# Patient Record
Sex: Female | Born: 1975 | Race: White | Hispanic: No | Marital: Married | Smoking: Never smoker
Health system: Southern US, Community
[De-identification: ages and names within clinical notes are randomized; demographics above are authoritative.]

## PROBLEM LIST (undated history)

## (undated) DIAGNOSIS — D219 Benign neoplasm of connective and other soft tissue, unspecified: Secondary | ICD-10-CM

## (undated) DIAGNOSIS — N889 Noninflammatory disorder of cervix uteri, unspecified: Secondary | ICD-10-CM

## (undated) DIAGNOSIS — Q512 Other doubling of uterus, unspecified: Secondary | ICD-10-CM

## (undated) DIAGNOSIS — Q5128 Other doubling of uterus, other specified: Secondary | ICD-10-CM

## (undated) HISTORY — PX: DILATION AND CURETTAGE OF UTERUS: SHX78

---

## 1998-07-25 ENCOUNTER — Other Ambulatory Visit: Admission: RE | Admit: 1998-07-25 | Discharge: 1998-07-25 | Payer: Self-pay | Admitting: Obstetrics and Gynecology

## 1999-07-27 ENCOUNTER — Other Ambulatory Visit: Admission: RE | Admit: 1999-07-27 | Discharge: 1999-07-27 | Payer: Self-pay | Admitting: Obstetrics and Gynecology

## 2004-10-03 ENCOUNTER — Ambulatory Visit: Payer: Self-pay | Admitting: Gastroenterology

## 2004-10-16 ENCOUNTER — Ambulatory Visit: Payer: Self-pay | Admitting: Gastroenterology

## 2005-05-22 ENCOUNTER — Ambulatory Visit: Payer: Self-pay | Admitting: Gastroenterology

## 2006-06-26 IMAGING — US US PELV - US TRANSVAGINAL
1 series · 17 of 25 positions shown · non-contrast
Comparison: none

REASON FOR EXAM: PELVIS PAIN
COMMENTS:

[Series 1: us pelv - us transvaginal · 17 of 44 slices shown]
[im 1/44]
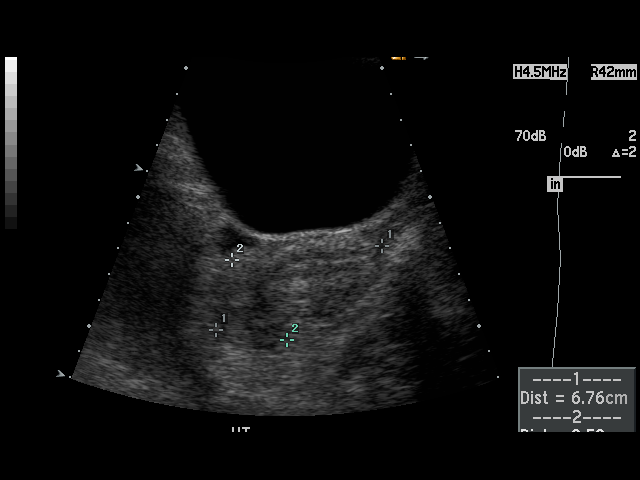
[im 4/44]
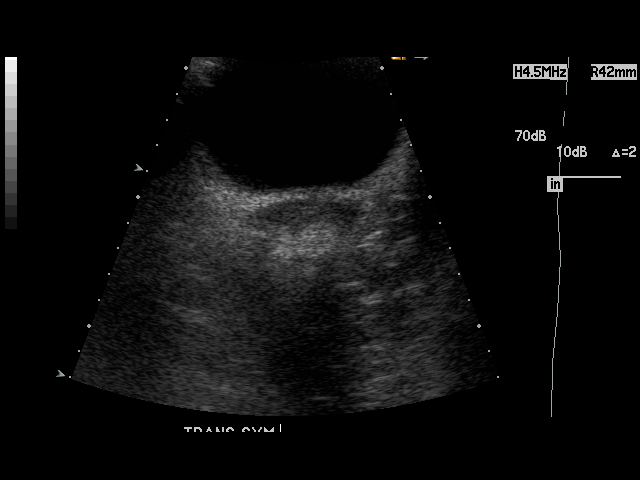
[im 6/44]
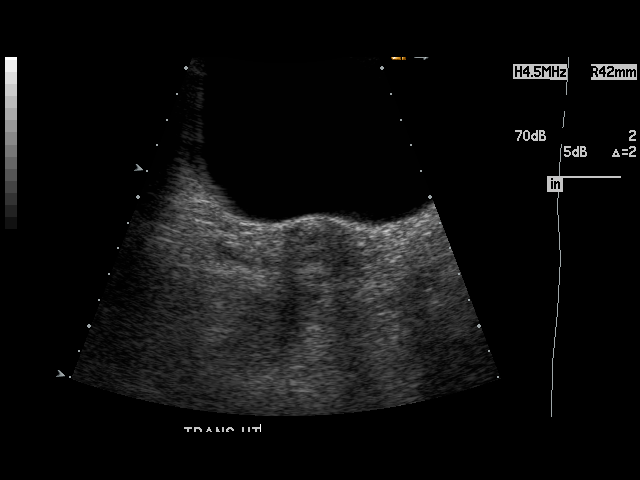
[im 9/44]
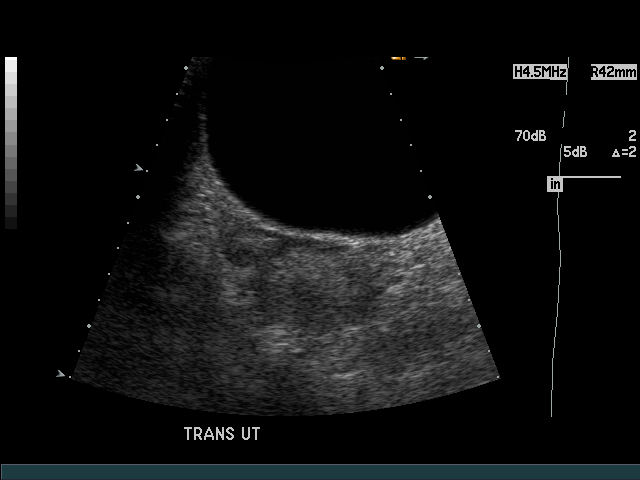
[im 11/44]
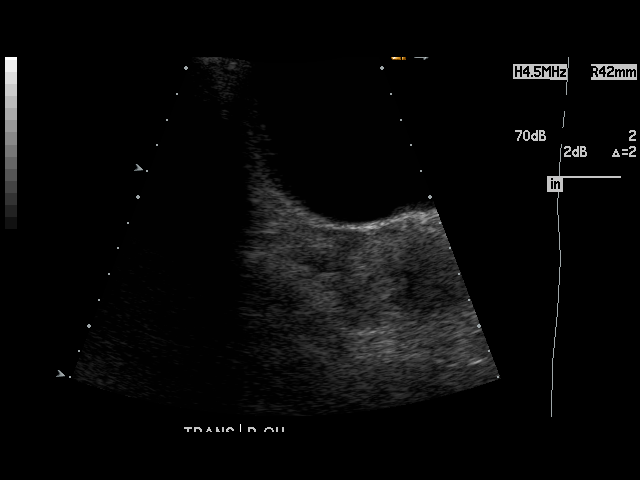
[im 15/44]
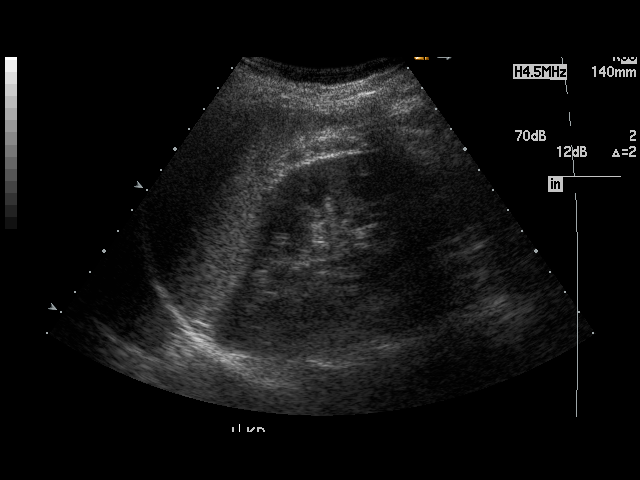
[im 17/44]
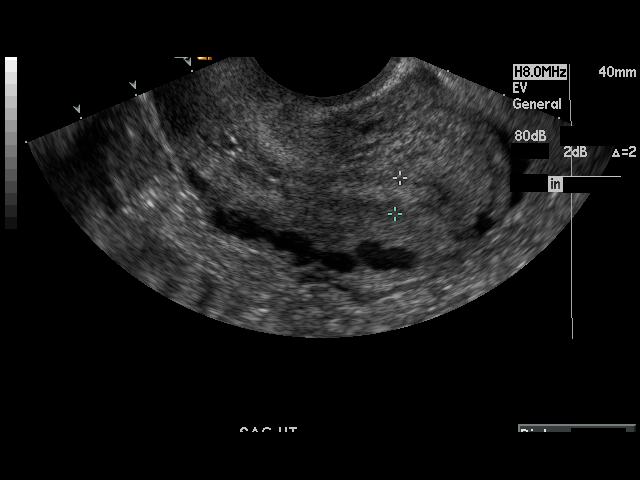
[im 20/44]
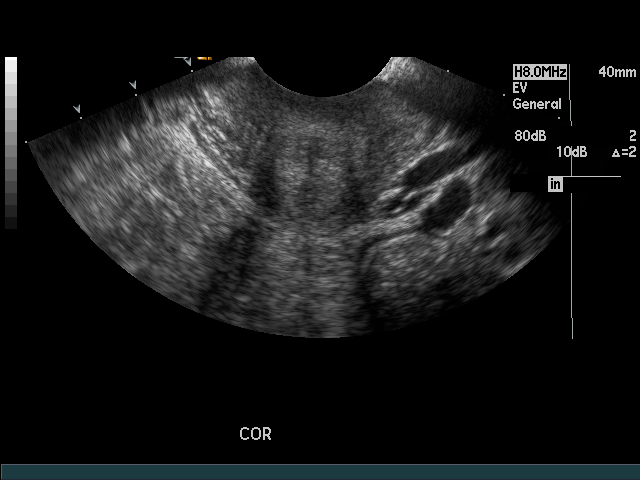
[im 22/44]
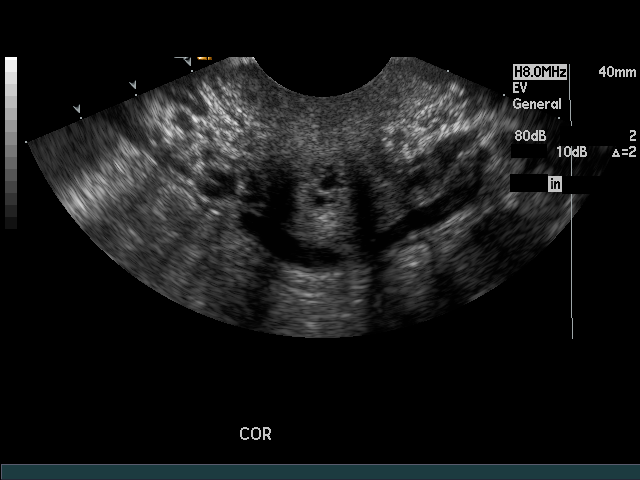
[im 24/44]
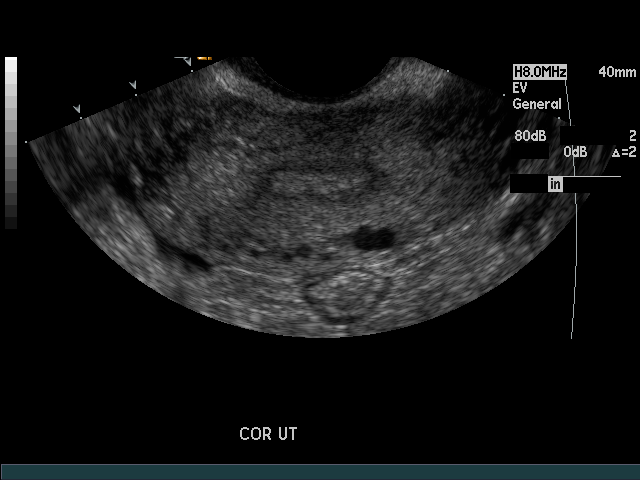
[im 27/44]
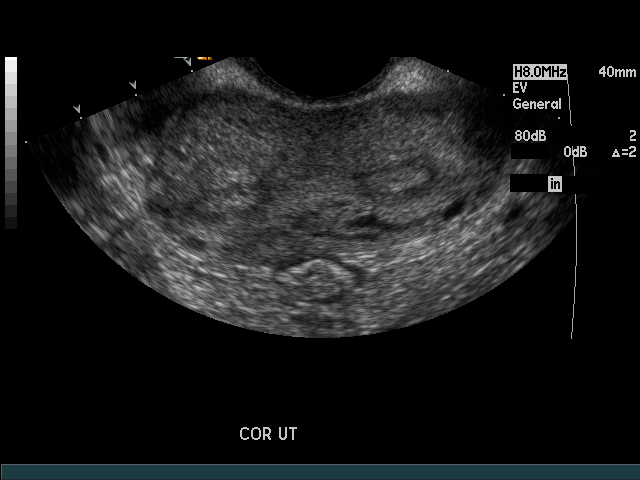
[im 29/44]
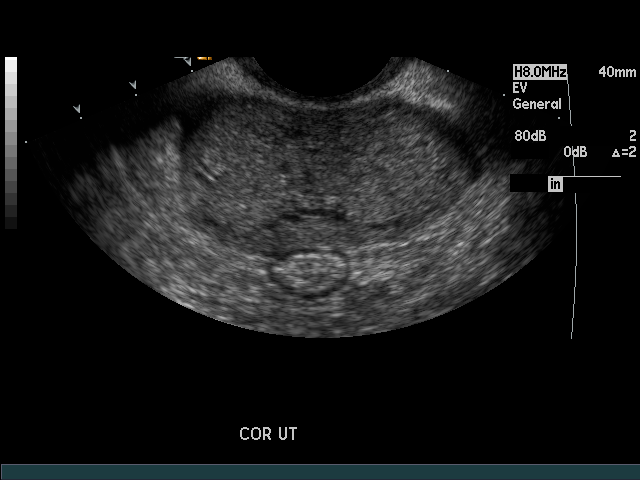
[im 33/44]
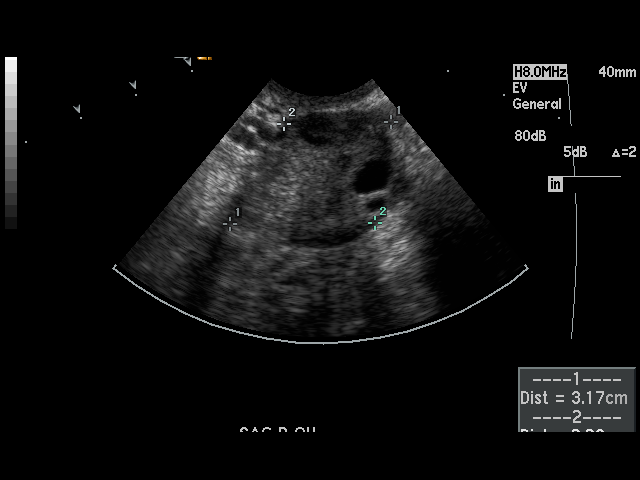
[im 35/44]
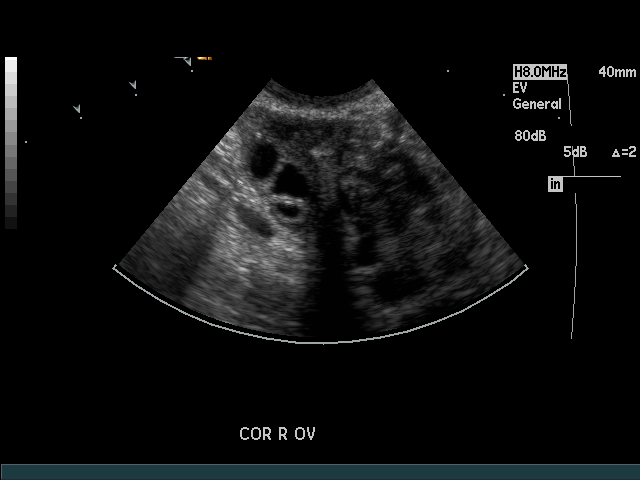
[im 38/44]
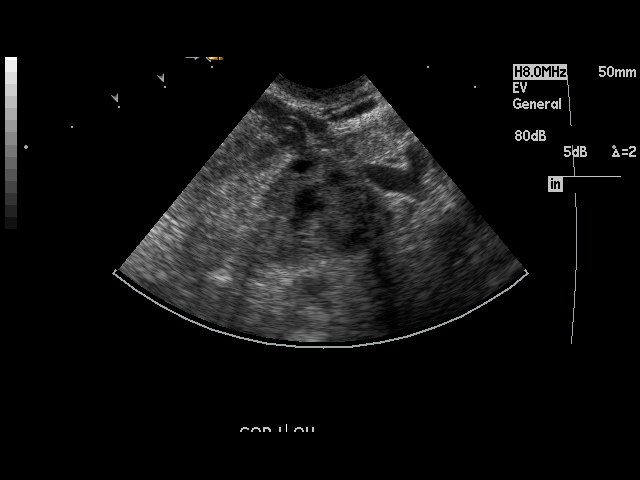
[im 40/44]
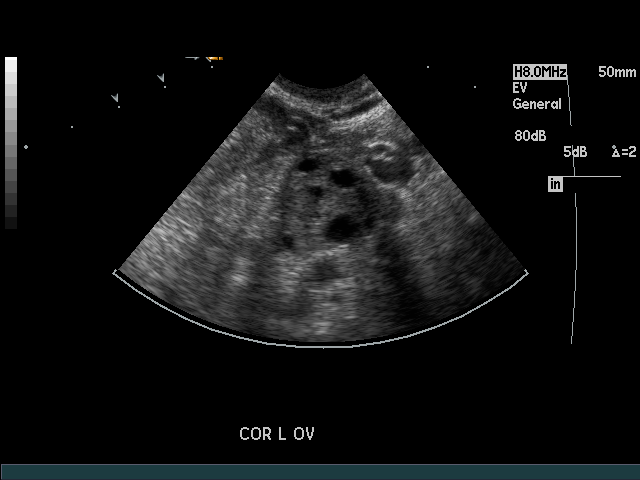
[im 44/44]
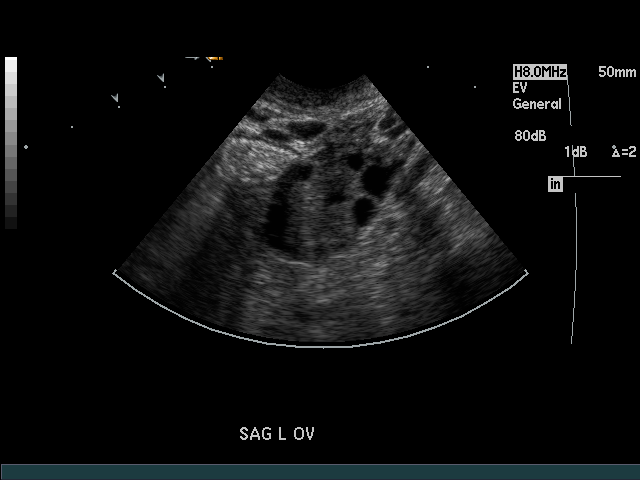

[17 of 25 positions shown; findings below may reference images not displayed]

PROCEDURE:     US  - US PELVIS MASS EXAM  - [DATE] [DATE] [DATE] [DATE]

RESULT:     Sonographic evaluation of the pelvis shows what appears to be a
bicornuate uterus.  The overall uterine length is 6.8 cm.  The ovaries
appear to be normal. There is fluid adjacent to the uterus. Multiple ovarian
follicles or cysts are present but are relatively small in size.
IMPRESSION: 1)There is some free fluid present in the pelvis.

2)Apparent bicornuate uterus.  Normal appearing ovaries.

## 2008-11-29 ENCOUNTER — Ambulatory Visit: Payer: Self-pay | Admitting: Gastroenterology

## 2009-11-12 ENCOUNTER — Ambulatory Visit: Payer: Self-pay | Admitting: Internal Medicine

## 2010-08-13 HISTORY — PX: TOTAL THYROIDECTOMY: SHX2547

## 2010-08-13 NOTE — L&D Delivery Note (Signed)
Delivery Note Called to check patient who was found to be completely dilated with bulging membranes.  Pt comfortable with epidural.  Amniotomy performed for port wine colored fluid.  At 0857,   a non-viable and stillborn female  was delivered spontaneously complete breech.  Legs and right arm delivered easily and left arm assisted out. There was some closure of cervix oven neck, so Dr Penne Lash was consulted and she delivered the head.  Baby taken to warmer and dried per mother's request. Parents grieving appropriately.  Placenta retained after delivery, so Pitocin 10 units were injected into cord and Cytotec 400mg  administered per rectum.   ;  .  APGAR: 0/0 , ; weight  TBA.   Placenta status: Undelivered. , . Cord pH: n/a.     Placenta to pathology. Cultures done.   Anesthesia: Epidural Episiotomy: none Lacerations: none Suture Repair: n/a Est. Blood Loss (mL):   Mom to postpartum.  Baby to NA.  Wynelle Bourgeois 06/21/2011, 9:21 AM

## 2010-11-08 DIAGNOSIS — Z3400 Encounter for supervision of normal first pregnancy, unspecified trimester: Secondary | ICD-10-CM

## 2010-11-13 ENCOUNTER — Ambulatory Visit: Payer: 59 | Admitting: Obstetrics and Gynecology

## 2010-11-13 DIAGNOSIS — O021 Missed abortion: Secondary | ICD-10-CM

## 2010-11-14 NOTE — Assessment & Plan Note (Unsigned)
NAME:  Annette Nguyen, Annette Nguyen NO.:  [id number]  MEDICAL RECORD NO.:  [id number]           PATIENT TYPE:  LOCATION:  [facility]           FACILITY:  PHYSICIAN:  Argentina Donovan, MD             DATE OF BIRTH:  DATE OF SERVICE:  11/13/2010                                 CLINIC NOTE  The patient is a 35 year old gravida 1 and para 0-0-1-0 who based on her last period, should have been 9 weeks 4 days, last week at 8 weeks 6 days according last menstrual period measured at 6 weeks 1 day.  An ultrasound with no fetal heart detected.  At that point, the [facility], then a beta was drawn which was 77,000.  She had one following here which was [phone].  I have talked to her about this probably would being a missed abortion with her husband and mother there, and we discussed the treatments being D and C or Cytotec p.o. or per vagina and because beta hCG was done in 2 different laboratories, I felt better about having it repeated one more time while will come back tomorrow for the results and if her choice, we will give her Cytotec 800 mcg to be used in something in addition for pain.  IMPRESSION:  Probable missed abortion.          ______________________________ Argentina Donovan, MD    PR/MEDQ  D:  11/13/2010  T:  11/14/2010  Job:  [id number]

## 2010-11-15 ENCOUNTER — Other Ambulatory Visit: Payer: 59

## 2010-11-16 ENCOUNTER — Other Ambulatory Visit: Payer: 59

## 2010-11-16 DIAGNOSIS — O021 Missed abortion: Secondary | ICD-10-CM

## 2010-11-17 ENCOUNTER — Ambulatory Visit (HOSPITAL_COMMUNITY)
Admission: RE | Admit: 2010-11-17 | Discharge: 2010-11-17 | Disposition: A | Discharge status: Home / Self Care | Payer: 59 | Source: Ambulatory Visit | Attending: Obstetrics & Gynecology | Admitting: Obstetrics & Gynecology

## 2010-11-17 ENCOUNTER — Other Ambulatory Visit: Payer: Self-pay | Admitting: Obstetrics & Gynecology

## 2010-11-17 DIAGNOSIS — O021 Missed abortion: Secondary | ICD-10-CM | POA: Insufficient documentation

## 2010-11-17 LAB — CBC
HCT: 41.8 % (ref 36.0–46.0)
Hemoglobin: 14.3 g/dL (ref 12.0–15.0)
MCH: 29.5 pg (ref 26.0–34.0)
MCHC: 34.2 g/dL (ref 30.0–36.0)
MCV: 86.4 fL (ref 78.0–100.0)
RDW: 12.7 % (ref 11.5–15.5)

## 2010-11-21 NOTE — Op Note (Signed)
NAMEJACQUELYNN, Nguyen             ACCOUNT NO.:  [id number]  MEDICAL RECORD NO.:  [id number]           PATIENT TYPE:  O  LOCATION:  [facility]                          [facility]  [facility]  PHYSICIAN:  Horton Chin, MD DATE OF BIRTH:  Jul 26, 1976  DATE OF PROCEDURE:  11/17/2010 DATE OF DISCHARGE:                              OPERATIVE REPORT   PREOPERATIVE DIAGNOSIS:  Missed abortion at around [redacted] weeks gestation.  POSTOPERATIVE DIAGNOSIS:  Missed abortion at around [redacted] weeks gestation.  PROCEDURE:  Dilation and evacuation.  SURGEON:  Horton Chin, MD  ANESTHESIOLOGIST:  Belva Agee, MD  ANESTHESIA:  MAC and a paracervical block using 30 mL of 0.5% Marcaine.  IV FLUIDS:  900 amounts of lactated Ringer's.  ESTIMATED BLOOD LOSS:  50 mL.  INDICATIONS:  The patient is a 35 year old gravida 1, now para 0-0-1-0 with known missed AB which measured around 6 weeks size and no cardiac activity and this has been going on over the last 3 weeks.  The patient failed expected management.  She declines misoprostol administration and desired D and E.  Prior to surgery, the risks of the procedure were discussed with the patient including but not limited to: bleeding which might require transfusion, infection which might require antibiotics, injury to uterus or surrounding organs, need for additional procedures, possibility of retained products which might also lead to additional procedures, risk of Asherman syndrome which might cause problems with future fertility and other postoperative and anesthetic complications. All the patient's questions were answered.  Written and informed consent was obtained.  FINDINGS:  Small-sized uterus, moderate amount of products of conception.  SPECIMENS:  Products of conception which were sent to pathology.  COMPLICATIONS:  None immediate.  PROCEDURE IN DETAIL:  The patient received preoperative intravenous doxycycline and had sequential compression  devices applied to her lower extremities.  She was then taken to the operating room where monitored anesthesia care was administered and found to be adequate.  The patient was then placed in a dorsal lithotomy position and prepped and draped in a sterile manner.  Her bladder was catheterized for unmeasured amount of clear yellow urine.  After an adequate time-out was performed, the vaginal speculum was placed.  Her cervix was identified and grasped with a tenaculum on the anterior lip.  A paracervical block was then administered.  Her cervix was dilated up to accommodate the 7-mm curved suction curette which was gently advanced into the uterine fundus.  It was attached to the suction device and the suction was activated.  The curette was slowly rotated to clear the uterus of all products of conception.  This was done a couple of times until a gritty texture was noted.  Complete emptying of the uterus was confirmed by a quick sharp curettage and then the suction curette was reintroduced into the uterine cavity to clear up all remaining debris.  The patient had minimal bleeding after the case.  All instruments were then removed from the patient's pelvis.  The patient tolerated the procedure well.  Sponge, instrument, and needle counts were correct x2.  She was taken to the recovery room  in stable condition.  The patient was given routine discharge instructions after D and E, and she was also told to call the [facility] or come to the emergency room if she has any concerning postoperative issues.  She was also told to call [facility] and make an appointment to be seen in the next 2 weeks for postoperative evaluation.  The patient was given a prescription for Percocet, Ibuprofen, and Colace, all of them 30 tablets each and she was told to call or come back in with any further concerns.     Horton Chin, MD     UAA/MEDQ  D:  11/17/2010  T:  12/03/2010  Job:   [id number]  Electronically Signed by Jaynie Collins MD on 11/21/2010 02:47:38 PM

## 2010-12-05 ENCOUNTER — Encounter: Payer: 59 | Admitting: Obstetrics and Gynecology

## 2010-12-05 DIAGNOSIS — O039 Complete or unspecified spontaneous abortion without complication: Secondary | ICD-10-CM

## 2010-12-05 DIAGNOSIS — Z09 Encounter for follow-up examination after completed treatment for conditions other than malignant neoplasm: Secondary | ICD-10-CM

## 2010-12-12 DEATH — deceased

## 2011-01-30 ENCOUNTER — Ambulatory Visit: Payer: Self-pay | Admitting: Otolaryngology

## 2011-02-05 ENCOUNTER — Encounter (INDEPENDENT_AMBULATORY_CARE_PROVIDER_SITE_OTHER): Payer: 59

## 2011-02-05 ENCOUNTER — Other Ambulatory Visit: Payer: Self-pay | Admitting: Family Medicine

## 2011-02-05 DIAGNOSIS — O3680X0 Pregnancy with inconclusive fetal viability, not applicable or unspecified: Secondary | ICD-10-CM

## 2011-02-05 DIAGNOSIS — Z3480 Encounter for supervision of other normal pregnancy, unspecified trimester: Secondary | ICD-10-CM

## 2011-02-06 ENCOUNTER — Ambulatory Visit: Payer: Self-pay | Admitting: Otolaryngology

## 2011-02-10 LAB — RUBELLA ANTIBODY, IGM: Rubella: IMMUNE

## 2011-02-10 LAB — RPR: RPR: NONREACTIVE

## 2011-02-10 LAB — HIV ANTIBODY (ROUTINE TESTING W REFLEX): HIV: NONREACTIVE

## 2011-02-10 LAB — ANTIBODY SCREEN: Antibody Screen: NEGATIVE

## 2011-02-19 ENCOUNTER — Ambulatory Visit (HOSPITAL_COMMUNITY): Payer: 59

## 2011-02-19 ENCOUNTER — Other Ambulatory Visit (INDEPENDENT_AMBULATORY_CARE_PROVIDER_SITE_OTHER): Payer: 59

## 2011-02-19 DIAGNOSIS — Z3480 Encounter for supervision of other normal pregnancy, unspecified trimester: Secondary | ICD-10-CM

## 2011-02-21 ENCOUNTER — Ambulatory Visit (HOSPITAL_COMMUNITY)
Admission: RE | Admit: 2011-02-21 | Discharge: 2011-02-21 | Disposition: A | Discharge status: Home / Self Care | Payer: 59 | Source: Ambulatory Visit | Attending: Family Medicine | Admitting: Family Medicine

## 2011-02-21 ENCOUNTER — Encounter (HOSPITAL_COMMUNITY): Payer: Self-pay

## 2011-02-21 DIAGNOSIS — O209 Hemorrhage in early pregnancy, unspecified: Secondary | ICD-10-CM | POA: Insufficient documentation

## 2011-02-21 DIAGNOSIS — Z3689 Encounter for other specified antenatal screening: Secondary | ICD-10-CM | POA: Insufficient documentation

## 2011-02-21 DIAGNOSIS — O3680X0 Pregnancy with inconclusive fetal viability, not applicable or unspecified: Secondary | ICD-10-CM

## 2011-02-28 ENCOUNTER — Other Ambulatory Visit: Payer: Self-pay | Admitting: Obstetrics & Gynecology

## 2011-02-28 ENCOUNTER — Encounter: Payer: Self-pay | Admitting: Obstetrics & Gynecology

## 2011-02-28 ENCOUNTER — Ambulatory Visit (INDEPENDENT_AMBULATORY_CARE_PROVIDER_SITE_OTHER): Payer: 59 | Admitting: Obstetrics & Gynecology

## 2011-02-28 VITALS — BP 117/73 | Wt 140.0 lb

## 2011-02-28 DIAGNOSIS — Z331 Pregnant state, incidental: Secondary | ICD-10-CM

## 2011-02-28 DIAGNOSIS — Z3682 Encounter for antenatal screening for nuchal translucency: Secondary | ICD-10-CM

## 2011-02-28 DIAGNOSIS — Z3480 Encounter for supervision of other normal pregnancy, unspecified trimester: Secondary | ICD-10-CM

## 2011-02-28 NOTE — Patient Instructions (Signed)
Pregnancy - First Trimester During sexual intercourse, millions of sperm go into the vagina. Only 1 sperm will penetrate and fertilize the female egg while it is in the Fallopian tube. One week later, the fertilized egg implants into the wall of the uterus. An embryo begins to develop into a baby. At 6 to 8 weeks, the eyes and face are formed and the heartbeat can be seen on ultrasound. At the end of 12 weeks (first trimester), all the baby's organs are formed. Now that you are pregnant, you will want to do everything you can to have a healthy baby. Two of the most important things are to get good prenatal care and follow your caregiver's instructions. Prenatal care is all the medical care you receive before the baby's birth. It is given to prevent, find and treat problems during the pregnancy and childbirth. PRENATAL EXAMS:  During prenatal visits, your weight, blood pressure and urine are checked. This is done to make sure you are healthy and progressing normally during the pregnancy.   A pregnant woman should gain 25 to 35 pounds during the pregnancy. However, if you are over weight or underweight, your caregiver will advise you regarding your weight.   Your caregiver will ask and answer questions for you.   Blood work, cervical cultures, other necessary tests and a Pap test are done during your prenatal exams. These tests are done to check on your health and the probable health of your baby. Tests are strongly recommended and done for HIV with your permission. This is the virus that causes AIDS. These tests are done because medications can be given to help prevent your baby from being born with this infection should you have been infected without knowing it. Blood work is also used to find out your blood type, previous infections and follow your blood levels (hemoglobin).   Low hemoglobin (anemia) is common during pregnancy. Iron and vitamins are given to help prevent this. Later in the pregnancy,  blood tests for diabetes will be done along with any other tests if any problems develop. You may need tests to make sure you and the baby are doing well.   You may need other tests to make sure you and the baby are doing well.  CHANGES DURING THE FIRST TRIMESTER (THE FIRST 3 MONTHS OF PREGNANCY) Your body goes through many changes during pregnancy. They vary from person to person. Talk to your caregiver about changes you notice and are concerned about. Changes can include:  Your menstrual period stops.   The egg and sperm carry the genes that determine what you look like. Genes from you and your partner are forming a baby. The female genes determine whether the baby is a boy or a girl.   Your body increases in girth and you may feel bloated.   Feeling sick to your stomach (nauseous) and throwing up (vomiting). If the vomiting is uncontrollable, call your caregiver.   Your breasts will begin to enlarge and become tender.   Your nipples may stick out more and become darker.   The need to urinate more. Painful urination may mean you have a bladder infection.   Tiring easily.   Loss of appetite.   Cravings for certain kinds of food.   At first, you may gain or lose a couple of pounds.   You may have changes in your emotions from day to day (excited to be pregnant or concerned something may go wrong with the pregnancy and baby).     You may have more vivid and strange dreams.  HOME CARE INSTRUCTIONS  It is very important to avoid all smoking, alcohol and un-prescribed drugs during your pregnancy. These affect the formation and growth of the baby. Avoid chemicals while pregnant to ensure the delivery of a healthy infant.   Start your prenatal visits by the 12th week of pregnancy. They are usually scheduled monthly at first, then more often in the last 2 months before delivery. Keep your caregiver's appointments. Follow your caregiver's instructions regarding medication use, blood and lab  tests, exercise, and diet.   During pregnancy, you are providing food for you and your baby. Eat regular, well-balanced meals. Choose foods such as meat, fish, milk and other low fat dairy products, vegetables, fruits, and whole-grain breads and cereals. Your caregiver will tell you of the ideal weight gain.   You can help morning sickness by keeping soda crackers (saltines) at the bedside. Eat a couple before arising in the morning. You may want to use the crackers without salt on them.   Eating 4 to 5 small meals rather than 3 large meals a day also may help the nausea and vomiting.   Drinking liquids between meals instead of during meals also seems to help nausea and vomiting.   A physical sexual relationship may be continued throughout pregnancy if there are no other problems. Problems may be early (premature) leaking of amniotic fluid from the membranes, vaginal bleeding, or belly (abdominal) pain.   Exercise regularly if there are no restrictions. Check with your caregiver or physical therapist if you are unsure of the safety of some of your exercises. Greater weight gain will occur in the last 2 trimesters of pregnancy. Exercising will help:   Control your weight.   Keep you in shape.   Prepare you for labor and delivery.   Help you lose your pregnancy weight after you deliver your baby.   Wear a good support or jogging bra for breast tenderness during pregnancy. This may help if worn during sleep too.   Ask when prenatal classes are available. Begin classes when they are offered.   Do not use hot tubs, steam rooms or saunas.   Wear your seat belt when driving. This protects you and your baby if you are in an accident.   Avoid raw meat, uncooked cheese, cat litter boxes and soil used by cats throughout the pregnancy. These carry germs that can cause birth defects in the baby.   The first trimester is a good time to visit your dentist for your dental health. Getting your teeth  cleaned is OK. Use a softer toothbrush and brush gently during pregnancy.   Ask for help if you have financial, counseling or nutritional needs during pregnancy. Your caregiver will be able to offer counseling for these needs as well as refer you for other special needs.   Do not take any medications or herbs unless told by your caregiver.   Inform your caregiver if there is any mental or physical domestic violence.   Make a list of emergency phone numbers of family, friends, hospital, police and fire department.   Write down your questions. Take them to your prenatal visit.   Do not douche.   Do not cross your legs.   If you have to stand for long periods of time, rotate you feet or take small steps in a circle.   You may have more vaginal secretions that may require a sanitary pad. Do not use tampons   or scented sanitary pads.  MEDICATIONS AND DRUG USE IN PREGNANCY  Take prenatal vitamins as directed. The vitamin should contain 1 milligram of folic acid. Keep all vitamins out of reach of children. Only a couple vitamins or tablets containing iron may be fatal to a baby or young child when ingested.   Avoid use of all medications, including herbs, over-the-counter medications, not prescribed or suggested by your caregiver. Only take over-the-counter or prescription medicines for pain, discomfort, or fever as directed by your caregiver. Do not use aspirin, ibuprofen (Motrin, Advil, Nuprin) or naproxen (Aleve) unless OK'd by your caregiver.   Let your caregiver also know about herbs you may be using.   Alcohol is related to a number of birth defects. This includes fetal alcohol syndrome. All alcohol, in any form, should be avoided completely. Smoking will cause low birth rate and premature babies.   Street/illegal drugs are very harmful to the baby. They are absolutely forbidden. A baby born to an addicted mother will be addicted at birth. The baby will go through the same withdrawal  an adult does.   Let your caregiver know about any medications that you have to take and for what reason you take them.  MISCARRIAGE IS COMMON DURING PREGNANCY A miscarriage does not mean you did something wrong. It is not a reason to worry about getting pregnant again. Your caregiver will help you with questions you may have. If you have a miscarriage, you may need minor surgery (a D & C). SEEK MEDICAL CARE IF:  You have any concerns or worries during your pregnancy. It is better to call with your questions if you feel they cannot wait, rather than worry about them.  SEEK IMMEDIATE MEDICAL CARE IF:  An unexplained oral temperature above 101F develops, or as your caregiver suggests.   You have leaking of fluid from the vagina (birth canal). If leaking membranes are suspected, take your temperature and inform your caregiver of this when you call.   There is vaginal spotting or bleeding. Notify your caregiver of the amount and how many pads are used.   You develop a bad smelling vaginal discharge with a change in the color.   You continue to feel sick to your stomach (nauseated) and have no relief from remedies suggested. You vomit blood or coffee ground like materials.   You lose more than 2 pounds of weight in one week.   You gain more than 2 pounds of weight in a week and you notice swelling of your face, hands, feet or legs.   You gain 5 pounds or more in 1 week (even if you do not have swelling of your hands, face, legs or feet).   You get exposed to [place] measles and have never had them.   You are exposed to fifth disease or chicken pox.   You develop belly (abdominal) pain. Round ligament discomfort is a common non-cancerous (benign) cause of abdominal pain in pregnancy. Your caregiver still must evaluate this.   You develop headache, fever, diarrhea, pain with urination, or shortness of breath.   You fall, are in a car accident or have any kind of trauma.   There is mental  or physical violence in your home.  Document Released: 07/24/2001 Document Re-Released: 01/17/2010 [facility] Patient Information 2011 [place], [place].

## 2011-02-28 NOTE — Progress Notes (Signed)
   Subjective:    Annette Nguyen is a G2P0010 [redacted]w[redacted]d being seen today for her first obstetrical visit.  Her obstetrical history is significant for prior SAB requiring D&E.Marland Kitchen Patient does intend to breast feed. Pregnancy history fully reviewed.  Patient reports no complaints.  There were no vitals filed for this visit.  HISTORY: OB History    Grav Para Term Preterm Abortions TAB SAB Ect Mult Living   2    1  1    0     # Outc Date GA Lbr Len/2nd Wgt Sex Del Anes PTL Lv   1 SAB 3/12 [redacted]w[redacted]d          Comments: D&E performed   2 CUR              No past medical history on file. No past surgical history on file. No family history on file.   Exam    Uterine Size: size equals dates  Pelvic Exam:    Perineum: No Hemorrhoids   Vulva: normal   Vagina:  normal mucosa   pH: Not done   Cervix: anteverted   Adnexa: normal adnexa   Bony Pelvis: gynecoid  System: Breast:  normal appearance, no masses or tenderness   Skin: normal coloration and turgor, no rashes    Neurologic: oriented, normal, negative, normal mood, gait normal; reflexes normal and symmetric   Extremities: normal strength, tone, and muscle mass   HEENT PERRLA   Mouth/Teeth mucous membranes moist, pharynx normal without lesions   Neck supple and no masses   Cardiovascular: regular rate and rhythm   Respiratory:  chest clear, no wheezing, crepitations, rhonchi, normal symmetric air entry   Abdomen: soft, non-tender; bowel sounds normal; no masses,  no organomegaly   Urinary: urethral meatus normal      Assessment:    Pregnancy: G2P0010 There is no problem list on file for this patient.       Plan:     Initial labs drawn. Prenatal vitamins. Problem list reviewed and updated. Genetic Screening discussed First Screen: requested.  Ultrasound discussed; fetal survey: requested.  Follow up in 4 weeks.  ANYANWU,UGONNA A 02/28/2011  ANYANWU,UGONNA A 02/28/2011

## 2011-03-21 ENCOUNTER — Other Ambulatory Visit (INDEPENDENT_AMBULATORY_CARE_PROVIDER_SITE_OTHER): Payer: 59 | Admitting: *Deleted

## 2011-03-21 DIAGNOSIS — N390 Urinary tract infection, site not specified: Secondary | ICD-10-CM

## 2011-03-21 LAB — POCT URINALYSIS DIPSTICK
Bilirubin, UA: NEGATIVE
Ketones, UA: NEGATIVE

## 2011-03-21 MED ORDER — NITROFURANTOIN MONOHYD MACRO 100 MG PO CAPS: 100.0000 mg | ORAL_CAPSULE | Freq: Two times a day (BID) | ORAL | Status: AC

## 2011-03-21 NOTE — Progress Notes (Signed)
Patient complaining of burning with urination and increased back pain.

## 2011-03-27 ENCOUNTER — Ambulatory Visit (INDEPENDENT_AMBULATORY_CARE_PROVIDER_SITE_OTHER): Payer: 59 | Admitting: Obstetrics and Gynecology

## 2011-03-27 VITALS — BP 115/74 | Wt 145.0 lb

## 2011-03-27 DIAGNOSIS — Z3480 Encounter for supervision of other normal pregnancy, unspecified trimester: Secondary | ICD-10-CM

## 2011-03-27 DIAGNOSIS — O26899 Other specified pregnancy related conditions, unspecified trimester: Secondary | ICD-10-CM

## 2011-03-27 DIAGNOSIS — R300 Dysuria: Secondary | ICD-10-CM

## 2011-03-27 NOTE — Progress Notes (Signed)
Patient complaining of still having some burning with urination.  She had a positive urinalysis here in the office last week.  She took Macrobid.  We sent the sample for culture but it was lost in transport.  We recollected the urine today and it was WNL.

## 2011-03-27 NOTE — Progress Notes (Signed)
Pt doing well. Reports persistent dysuria, last dose of macrobid tomorrow. Patient scheduled for integrated screen 8/15. Urine culture ordered today.

## 2011-03-28 ENCOUNTER — Ambulatory Visit (HOSPITAL_COMMUNITY)
Admission: RE | Admit: 2011-03-28 | Discharge: 2011-03-28 | Disposition: A | Discharge status: Home / Self Care | Payer: 59 | Source: Ambulatory Visit | Attending: Obstetrics & Gynecology | Admitting: Obstetrics & Gynecology

## 2011-03-28 VITALS — BP 112/79 | HR 81 | Wt 145.0 lb

## 2011-03-28 DIAGNOSIS — O351XX0 Maternal care for (suspected) chromosomal abnormality in fetus, not applicable or unspecified: Secondary | ICD-10-CM | POA: Insufficient documentation

## 2011-03-28 DIAGNOSIS — O3410 Maternal care for benign tumor of corpus uteri, unspecified trimester: Secondary | ICD-10-CM | POA: Insufficient documentation

## 2011-03-28 DIAGNOSIS — Z3682 Encounter for antenatal screening for nuchal translucency: Secondary | ICD-10-CM

## 2011-03-28 DIAGNOSIS — O3400 Maternal care for unspecified congenital malformation of uterus, unspecified trimester: Secondary | ICD-10-CM | POA: Insufficient documentation

## 2011-03-28 DIAGNOSIS — Z3689 Encounter for other specified antenatal screening: Secondary | ICD-10-CM | POA: Insufficient documentation

## 2011-03-28 DIAGNOSIS — O3510X0 Maternal care for (suspected) chromosomal abnormality in fetus, unspecified, not applicable or unspecified: Secondary | ICD-10-CM | POA: Insufficient documentation

## 2011-03-28 NOTE — Progress Notes (Signed)
Vital signs reviewed; see ultrasound report 

## 2011-03-29 ENCOUNTER — Other Ambulatory Visit: Payer: Self-pay | Admitting: Maternal and Fetal Medicine

## 2011-04-23 ENCOUNTER — Encounter: Payer: 59 | Admitting: Obstetrics and Gynecology

## 2011-04-24 ENCOUNTER — Encounter: Payer: 59 | Admitting: Family Medicine

## 2011-04-26 ENCOUNTER — Ambulatory Visit (INDEPENDENT_AMBULATORY_CARE_PROVIDER_SITE_OTHER): Payer: 59 | Admitting: Obstetrics and Gynecology

## 2011-04-26 DIAGNOSIS — Z3480 Encounter for supervision of other normal pregnancy, unspecified trimester: Secondary | ICD-10-CM

## 2011-04-26 NOTE — Patient Instructions (Signed)
Round Ligament Pain The round ligament is made up of muscle and fibrous tissue. It is attached to the uterus near the fallopian tube. The round ligament is located on both sides of the uterus and helps support the position of the uterus. It usually begins in the second trimester of pregnancy when the uterus comes out of the pelvis. The pain can come and go until the baby is delivered. Round ligament pain is not a serious problem and does not cause harm to the baby. CAUSE During pregnancy the uterus grows the most from the second trimester to delivery. As it grows, it stretches and slightly twists the round ligaments. When the uterus leans from one side to the other, the round ligament on the opposite side pulls and stretches. This can cause pain. SYMPTOMS Pain can occur on one side or both sides. The pain is usually a short, sharp and pinching like. Sometimes it can be a dull, lingering and aching pain. The pain is located in the lower side of the abdomen or in the groin. The pain is internal and usually starts deep in the groin and moves up to the outside of the hip area. Pain can occur with:  Sudden change in position like getting out of bed or a chair.  Rolling over in bed.   Coughing or sneezing.  Walking too much.   Any type of physical activity.   DIAGNOSIS Your caregiver will make sure there are no serious problems causing the pain. When nothing serious is found, the symptoms usually indicate that the pain is from the round ligament. TREATMENT  Sit down and relax when the pain starts.   Flex your knees up to your belly.   Lay on your side with a pillow under your belly (abdomen) and another one between your legs.   Sit in a hot bath for 15 to 20 minutes or until the pain goes away.  HOME CARE INSTRUCTIONS  Only take over-the-counter or prescriptions medicines for pain, discomfort or fever as directed by your caregiver.   Sit and stand slowly.   Avoid long walks if it causes  pain.   Stop or lessen your physical activities if it causes pain.  SEEK MEDICAL CARE IF:  The pain does not go away with any of your treatment.   You need stronger medication for the pain.   You develop back pain that you did not have before with the side pain.  SEEK IMMEDIATE MEDICAL CARE IF:  You develop a temperature of 100.4 or higher.   You develop uterine contractions.   You develop vaginal bleeding.   You develop nausea, vomiting or diarrhea.   You develop chills.   You have pain when you urinate.  Document Released: 05/08/2008 Document Re-Released: 12/16/2008 [facility] Patient Information 2011 [place], [place].    Pregnancy - Second Trimester The second trimester of pregnancy (3 to 6 months) is a period of rapid growth for you and your baby. At the end of the sixth month, your baby is about 9 inches long and weighs 1 1/2 pounds. You will begin to feel the baby move between 18 and 20 weeks of the pregnancy. This is called quickening. Weight gain is faster. A clear fluid (colostrum) may leak out of your breasts. You may feel small contractions of the womb (uterus). This is known as false labor or Braxton-Hicks contractions. This is like a practice for labor when the baby is ready to be born. Usually, the problems with morning sickness  have usually passed by the end of your first trimester. Some women develop small dark blotches (called cholasma, mask of pregnancy) on their face that usually goes away after the baby is born. Exposure to the sun makes the blotches worse. Acne may also develop in some pregnant women and pregnant women who have acne, may find that it goes away. PRENATAL EXAMS  Blood work may continue to be done during prenatal exams. These tests are done to check on your health and the probable health of your baby. Blood work is used to follow your blood levels (hemoglobin). Anemia (low hemoglobin) is common during pregnancy. Iron and vitamins are given to help  prevent this. You will also be checked for diabetes between 24 and 28 weeks of the pregnancy. Some of the previous blood tests may be repeated.   The size of the uterus is measured during each visit. This is to make sure that the baby is continuing to grow properly according to the dates of the pregnancy.   Your blood pressure is checked every prenatal visit. This is to make sure you are not getting toxemia.   Your urine is checked to make sure you do not have an infection, diabetes or protein in the urine.   Your weight is checked often to make sure gains are happening at the suggested rate. This is to ensure that both you and your baby are growing normally.   Sometimes, an ultrasound is performed to confirm the proper growth and development of the baby. This is a test which bounces harmless sound waves off the baby so your caregiver can more accurately determine due dates.  Sometimes, a specialized test is done on the amniotic fluid surrounding the baby. This test is called an amniocentesis. The amniotic fluid is obtained by sticking a needle into the belly (abdomen). This is done to check the chromosomes in instances where there is a concern about possible genetic problems with the baby. It is also sometimes done near the end of pregnancy if an early delivery is required. In this case, it is done to help make sure the baby's lungs are mature enough for the baby to live outside of the womb. CHANGES OCCURING IN THE SECOND TRIMESTER OF PREGNANCY Your body goes through many changes during pregnancy. They vary from person to person. Talk to your caregiver about changes you notice that you are concerned about.  During the second trimester, you will likely have an increase in your appetite. It is normal to have cravings for certain foods. This varies from person to person and pregnancy to pregnancy.   Your lower abdomen will begin to bulge.   You may have to urinate more often because the uterus and  baby are pressing on your bladder. It is also common to get more bladder infections during pregnancy (pain with urination). You can help this by drinking lots of fluids and emptying your bladder before and after intercourse.   You may begin to get stretch marks on your hips, abdomen, and breasts. These are normal changes in the body during pregnancy. There are no exercises or medications to take that prevent this change.   You may begin to develop swollen and bulging veins (varicose veins) in your legs. Wearing support hose, elevating your feet for 15 minutes, 3 to 4 times a day and limiting salt in your diet helps lessen the problem.   Heartburn may develop as the uterus grows and pushes up against the stomach. Antacids recommended by  your caregiver helps with this problem. Also, eating smaller meals 4 to 5 times a day helps.   Constipation can be treated with a stool softener or adding bulk to your diet. Drinking lots of fluids, vegetables, fruits, and whole grains are helpful.   Exercising is also helpful. If you have been very active up until your pregnancy, most of these activities can be continued during your pregnancy. If you have been less active, it is helpful to start an exercise program such as walking.   Hemorrhoids (varicose veins in the rectum) may develop at the end of the second trimester. Warm sitz baths and hemorrhoid cream recommended by your caregiver helps hemorrhoid problems.   Backaches may develop during this time of your pregnancy. Avoid heavy lifting, wear low heal shoes and practice good posture to help with backache problems.   Some pregnant women develop tingling and numbness of their hand and fingers because of swelling and tightening of ligaments in the wrist (carpel tunnel syndrome). This goes away after the baby is born.   As your breasts enlarge, you may have to get a bigger bra. Get a comfortable, cotton, support bra. Do not get a nursing bra until the last month  of the pregnancy if you will be nursing the baby.   You may get a dark line from your belly button to the pubic area called the linea nigra.   You may develop rosy cheeks because of increase blood flow to the face.   You may develop spider looking lines of the face, neck, arms and chest. These go away after the baby is born.  HOME CARE INSTRUCTIONS  It is extremely important to avoid all smoking, herbs, alcohol, and un-prescribed drugs during your pregnancy. These chemicals affect the formation and growth of the baby. Avoid these chemicals throughout the pregnancy to ensure the delivery of a healthy infant.   Most of your home care instructions are the same as suggested for the first trimester of your pregnancy. Keep your caregiver's appointments. Follow your caregiver's instructions regarding medication use, exercise and diet.   During pregnancy, you are providing food for you and your baby. Continue to eat regular, well-balanced meals. Choose foods such as meat, fish, milk and other low fat dairy products, vegetables, fruits, and whole-grain breads and cereals. Your caregiver will tell you of the ideal weight gain.   A physical sexual relationship may be continued up until near the end of pregnancy if there are no other problems. Problems could include early (premature) leaking of amniotic fluid from the membranes, vaginal bleeding, abdominal pain, or other medical or pregnancy problems.   Exercise regularly if there are no restrictions. Check with your caregiver if you are unsure of the safety of some of your exercises. The greatest weight gain will occur in the last 2 trimesters of pregnancy. Exercise will help you:   Control your weight.   Get you in shape for labor and delivery.   Lose weight after you have the baby.   Wear a good support or jogging bra for breast tenderness during pregnancy. This may help if worn during sleep. Pads or tissues may be used in the bra if you are leaking  colostrum.   Do not use hot tubs, steam rooms or saunas throughout the pregnancy.   Wear your seat belt at all times when driving. This protects you and your baby if you are in an accident.   Avoid raw meat, uncooked cheese, cat litter boxes and  soil used by cats. These carry germs that can cause birth defects in the baby.   The second trimester is also a good time to visit your dentist for your dental health if this has not been done yet. Getting your teeth cleaned is OK. Use a soft toothbrush. Brush gently during pregnancy.   It is easier to loose urine during pregnancy. Tightening up and strengthening the pelvic muscles will help with this problem. Practice stopping your urination while you are going to the bathroom. These are the same muscles you need to strengthen. It is also the muscles you would use as if you were trying to stop from passing gas. You can practice tightening these muscles up 10 times a set and repeating this about 3 times per day. Once you know what muscles to tighten up, do not perform these exercises during urination. It is more likely to contribute to an infection by backing up the urine.   Ask for help if you have financial, counseling or nutritional needs during pregnancy. Your caregiver will be able to offer counseling for these needs as well as refer you for other special needs.   Your skin may become oily. If so, wash your face with mild soap, use non-greasy moisturizer and oil or cream based makeup.  MEDICATIONS AND DRUG USE IN PREGNANCY  Take prenatal vitamins as directed. The vitamin should contain 1 milligram of folic acid. Keep all vitamins out of reach of children. Only a couple vitamins or tablets containing iron may be fatal to a baby or young child when ingested.   Avoid use of all medications, including herbs, over-the-counter medications, not prescribed or suggested by your caregiver. Only take over-the-counter or prescription medicines for pain,  discomfort, or fever as directed by your caregiver. Do not use aspirin.   Let your caregiver also know about herbs you may be using.   Alcohol is related to a number of birth defects. This includes fetal alcohol syndrome. All alcohol, in any form, should be avoided completely. Smoking will cause low birth rate and premature babies.   Street/illegal drugs are very harmful to the baby. They are absolutely forbidden. A baby born to an addicted mother will be addicted at birth. The baby will go through the same withdrawal an adult does.  SEEK MEDICAL CARE IF:  You have any concerns or worries during your pregnancy. It is better to call with your questions if you feel they cannot wait, rather than worry about them.  SEEK IMMEDIATE MEDICAL CARE IF:  An unexplained oral temperature above 100.4 develops, or as your caregiver suggests.   You have leaking of fluid from the vagina (birth canal). If leaking membranes are suspected, take your temperature and tell your caregiver of this when you call.   There is vaginal spotting, bleeding, or passing clots. Tell your caregiver of the amount and how many pads are used. Light spotting in pregnancy is common, especially following intercourse.   You develop a bad smelling vaginal discharge with a change in the color from clear to white.   You continue to feel sick to your stomach (nauseated) and have no relief from remedies suggested. You vomit blood or coffee ground like materials.   You loose more than 2 pounds of weight or gain more than 2 pounds of weight over a weeks time, or as suggested by your caregiver.   You notice swelling of your face, hands, and feet or legs.   You get exposed to [place] measles  and have never had them.   You are exposed to fifth disease or chicken pox.   You develop belly (abdominal) pain. Round ligament discomfort is a common non-cancerous (benign) cause of abdominal pain in pregnancy. Your caregiver still must evaluate  you.   You develop a bad headache that does not go away.   You develop fever, diarrhea, pain with urination or shortness of breath.   You develop visual problems, blurry or double vision.   You fall, are in a car accident or any kind of trauma.   There is mental or physical violence at home.  Document Released: 07/24/2001 Document Re-Released: 10/24/2009 [facility] Patient Information 2011 [employer], [place].

## 2011-04-26 NOTE — Progress Notes (Signed)
Having some round ligament pain and last week had a sharp pain at her naval.

## 2011-04-26 NOTE — Progress Notes (Signed)
Addended by: Vinnie Langton C on: 04/26/2011 02:23 PM   Modules accepted: Orders

## 2011-04-26 NOTE — Progress Notes (Signed)
Patient doing well, reports occasional sharp pains bilaterally c/w round ligament pain. Will order AFP today. Will schedule anatomy ultrasound at 18 weeks.

## 2011-05-03 ENCOUNTER — Other Ambulatory Visit: Payer: 59 | Admitting: *Deleted

## 2011-05-03 DIAGNOSIS — N898 Other specified noninflammatory disorders of vagina: Secondary | ICD-10-CM

## 2011-05-03 NOTE — Progress Notes (Signed)
Patient has had an increase in vaginal discharge, she wishes to have it checked to be safe.  Discharge is thick and white with no odor.    Amniotic fluid swab is negative, it remained yellow  Wet Prep collected and we will call patient tomorrow with results.

## 2011-05-04 ENCOUNTER — Telehealth: Payer: Self-pay | Admitting: *Deleted

## 2011-05-04 DIAGNOSIS — K219 Gastro-esophageal reflux disease without esophagitis: Secondary | ICD-10-CM

## 2011-05-04 LAB — WET PREP, GENITAL: Clue Cells Wet Prep HPF POC: NONE SEEN

## 2011-05-04 MED ORDER — PANTOPRAZOLE SODIUM 40 MG PO TBEC: 40.0000 mg | DELAYED_RELEASE_TABLET | Freq: Every day | ORAL | Status: DC

## 2011-05-04 NOTE — Telephone Encounter (Signed)
Patient called to request results and to see if we could refill her protonix for  Acid reflux.  She gets this [facility].  She also asked if the medication does not arrive in time what can she take otc.  I recommended Prilosec or zantac.

## 2011-05-17 ENCOUNTER — Ambulatory Visit (HOSPITAL_COMMUNITY)
Admission: RE | Admit: 2011-05-17 | Discharge: 2011-05-17 | Disposition: A | Discharge status: Home / Self Care | Payer: 59 | Source: Ambulatory Visit | Attending: Obstetrics and Gynecology | Admitting: Obstetrics and Gynecology

## 2011-05-17 DIAGNOSIS — O358XX0 Maternal care for other (suspected) fetal abnormality and damage, not applicable or unspecified: Secondary | ICD-10-CM | POA: Insufficient documentation

## 2011-05-17 DIAGNOSIS — Z1389 Encounter for screening for other disorder: Secondary | ICD-10-CM | POA: Insufficient documentation

## 2011-05-17 DIAGNOSIS — Z363 Encounter for antenatal screening for malformations: Secondary | ICD-10-CM | POA: Insufficient documentation

## 2011-05-29 ENCOUNTER — Ambulatory Visit (INDEPENDENT_AMBULATORY_CARE_PROVIDER_SITE_OTHER): Payer: 59 | Admitting: Obstetrics and Gynecology

## 2011-05-29 DIAGNOSIS — Z23 Encounter for immunization: Secondary | ICD-10-CM

## 2011-05-29 DIAGNOSIS — Z3480 Encounter for supervision of other normal pregnancy, unspecified trimester: Secondary | ICD-10-CM

## 2011-05-29 MED ORDER — INFLUENZA VIRUS VACC SPLIT PF IM SUSP: 0.5000 mL | Freq: Once | INTRAMUSCULAR | Status: DC

## 2011-05-29 NOTE — Progress Notes (Signed)
Patient without complaints. Will obtain results of AFP. Patient to recive flu vaccine today RTC in 4 week

## 2011-05-29 NOTE — Progress Notes (Signed)
Patient is here for routine OB check.  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  When does the kidney ultrasound need to be done, before or after delivery.  See MFM report.  Also having pain in belly button area for about one month, gradually feeling it more often.

## 2011-06-20 ENCOUNTER — Encounter (HOSPITAL_COMMUNITY): Payer: Self-pay | Admitting: *Deleted

## 2011-06-20 ENCOUNTER — Ambulatory Visit (INDEPENDENT_AMBULATORY_CARE_PROVIDER_SITE_OTHER): Payer: 59 | Admitting: Obstetrics & Gynecology

## 2011-06-20 ENCOUNTER — Inpatient Hospital Stay (HOSPITAL_COMMUNITY): Payer: 59 | Admitting: Anesthesiology

## 2011-06-20 ENCOUNTER — Inpatient Hospital Stay (HOSPITAL_COMMUNITY)
Admission: AD | Admit: 2011-06-20 | Discharge: 2011-06-22 | DRG: 774 | Disposition: A | Discharge status: Home / Self Care | Payer: 59 | Source: Ambulatory Visit | Attending: Obstetrics & Gynecology | Admitting: Obstetrics & Gynecology

## 2011-06-20 ENCOUNTER — Encounter (HOSPITAL_COMMUNITY): Payer: Self-pay | Admitting: Anesthesiology

## 2011-06-20 VITALS — BP 120/76 | Wt 162.0 lb

## 2011-06-20 DIAGNOSIS — O364XX0 Maternal care for intrauterine death, not applicable or unspecified: Principal | ICD-10-CM | POA: Diagnosis present

## 2011-06-20 DIAGNOSIS — O321XX0 Maternal care for breech presentation, not applicable or unspecified: Secondary | ICD-10-CM | POA: Diagnosis present

## 2011-06-20 DIAGNOSIS — Z3480 Encounter for supervision of other normal pregnancy, unspecified trimester: Secondary | ICD-10-CM

## 2011-06-20 HISTORY — DX: Benign neoplasm of connective and other soft tissue, unspecified: D21.9

## 2011-06-20 HISTORY — DX: Other doubling of uterus, unspecified: Q51.20

## 2011-06-20 HISTORY — DX: Other and unspecified doubling of uterus: Q51.28

## 2011-06-20 HISTORY — DX: Noninflammatory disorder of cervix uteri, unspecified: N88.9

## 2011-06-20 LAB — DIC (DISSEMINATED INTRAVASCULAR COAGULATION)PANEL
INR: 0.97 (ref 0.00–1.49)
Platelets: 280 10*3/uL (ref 150–400)
Prothrombin Time: 13.1 seconds (ref 11.6–15.2)
aPTT: 27 seconds (ref 24–37)

## 2011-06-20 LAB — CBC
HCT: 35.6 % — ABNORMAL LOW (ref 36.0–46.0)
MCV: 87.9 fL (ref 78.0–100.0)
Platelets: 280 10*3/uL (ref 150–400)
RBC: 4.05 MIL/uL (ref 3.87–5.11)
WBC: 13.7 10*3/uL — ABNORMAL HIGH (ref 4.0–10.5)

## 2011-06-20 LAB — HEMOGLOBIN A1C
Hgb A1c MFr Bld: 5.1 % (ref <5.7)
Mean Plasma Glucose: 100 mg/dL (ref <117)

## 2011-06-20 LAB — DIFFERENTIAL
Eosinophils Relative: 0 % (ref 0–5)
Lymphocytes Relative: 18 % (ref 12–46)
Lymphs Abs: 2.4 10*3/uL (ref 0.7–4.0)

## 2011-06-20 LAB — RAPID URINE DRUG SCREEN, HOSP PERFORMED
Amphetamines: NOT DETECTED
Opiates: NOT DETECTED

## 2011-06-20 MED ORDER — SODIUM CHLORIDE 0.9 % IJ SOLN: 3.0000 mL | INTRAMUSCULAR | Status: DC | PRN

## 2011-06-20 MED ORDER — ONDANSETRON HCL 4 MG/2ML IJ SOLN
4.0000 mg | Freq: Four times a day (QID) | INTRAMUSCULAR | Status: DC | PRN
  Administered 2011-06-20: 4 mg via INTRAVENOUS
  Filled 2011-06-20: qty 2

## 2011-06-20 MED ORDER — OXYTOCIN BOLUS FROM INFUSION
500.0000 mL | Freq: Once | INTRAVENOUS | Status: DC
  Filled 2011-06-20 (×2): qty 1000
  Filled 2011-06-20: qty 500

## 2011-06-20 MED ORDER — HYDROXYZINE HCL 50 MG PO TABS: 50.0000 mg | ORAL_TABLET | Freq: Four times a day (QID) | ORAL | Status: DC | PRN

## 2011-06-20 MED ORDER — MISOPROSTOL 200 MCG PO TABS
600.0000 ug | ORAL_TABLET | Freq: Once | ORAL | Status: AC
  Administered 2011-06-20: 600 ug via VAGINAL
  Filled 2011-06-20: qty 1

## 2011-06-20 MED ORDER — PHENYLEPHRINE 40 MCG/ML (10ML) SYRINGE FOR IV PUSH (FOR BLOOD PRESSURE SUPPORT): 80.0000 ug | PREFILLED_SYRINGE | INTRAVENOUS | Status: DC | PRN

## 2011-06-20 MED ORDER — HYDROXYZINE HCL 50 MG/ML IM SOLN: 50.0000 mg | Freq: Four times a day (QID) | INTRAMUSCULAR | Status: DC | PRN

## 2011-06-20 MED ORDER — IBUPROFEN 600 MG PO TABS
600.0000 mg | ORAL_TABLET | Freq: Four times a day (QID) | ORAL | Status: DC | PRN
  Filled 2011-06-20 (×3): qty 1

## 2011-06-20 MED ORDER — MISOPROSTOL 200 MCG PO TABS
400.0000 ug | ORAL_TABLET | ORAL | Status: DC
  Administered 2011-06-21: 400 ug via VAGINAL

## 2011-06-20 MED ORDER — GENTAMICIN IN SALINE 1-0.9 MG/ML-% IV SOLN
100.0000 mg | Freq: Three times a day (TID) | INTRAVENOUS | Status: AC
  Administered 2011-06-20 – 2011-06-21 (×3): 100 mg via INTRAVENOUS
  Filled 2011-06-20 (×3): qty 100

## 2011-06-20 MED ORDER — ACETAMINOPHEN 325 MG PO TABS
650.0000 mg | ORAL_TABLET | Freq: Four times a day (QID) | ORAL | Status: DC | PRN
  Administered 2011-06-20: 650 mg via ORAL
  Filled 2011-06-20: qty 2
  Filled 2011-06-20: qty 1

## 2011-06-20 MED ORDER — FENTANYL 2.5 MCG/ML BUPIVACAINE 1/10 % EPIDURAL INFUSION (WH - ANES)
14.0000 mL/h | INTRAMUSCULAR | Status: DC
  Administered 2011-06-20 – 2011-06-21 (×3): 14 mL/h via EPIDURAL
  Filled 2011-06-20 (×4): qty 60

## 2011-06-20 MED ORDER — MISOPROSTOL 200 MCG PO TABS
400.0000 ug | ORAL_TABLET | ORAL | Status: DC
  Administered 2011-06-20 – 2011-06-21 (×3): 400 ug via VAGINAL
  Filled 2011-06-20 (×11): qty 2

## 2011-06-20 MED ORDER — EPHEDRINE 5 MG/ML INJ: 10.0000 mg | INTRAVENOUS | Status: DC | PRN

## 2011-06-20 MED ORDER — OXYCODONE-ACETAMINOPHEN 5-325 MG PO TABS: 2.0000 | ORAL_TABLET | ORAL | Status: DC | PRN

## 2011-06-20 MED ORDER — LACTATED RINGERS IV SOLN
INTRAVENOUS | Status: DC
  Administered 2011-06-20: 23:00:00 via INTRAVENOUS

## 2011-06-20 MED ORDER — OXYTOCIN 20 UNITS IN LACTATED RINGERS INFUSION - SIMPLE
125.0000 mL/h | Freq: Once | INTRAVENOUS | Status: AC
  Administered 2011-06-21: 125 mL/h via INTRAVENOUS

## 2011-06-20 MED ORDER — DIPHENHYDRAMINE HCL 50 MG/ML IJ SOLN
12.5000 mg | INTRAMUSCULAR | Status: DC | PRN
  Administered 2011-06-21: 12.5 mg via INTRAVENOUS
  Filled 2011-06-20: qty 1

## 2011-06-20 MED ORDER — ACETAMINOPHEN 325 MG PO TABS
650.0000 mg | ORAL_TABLET | ORAL | Status: DC | PRN
  Administered 2011-06-21: 650 mg via ORAL
  Filled 2011-06-20 (×2): qty 2

## 2011-06-20 MED ORDER — LACTATED RINGERS IV SOLN
500.0000 mL | INTRAVENOUS | Status: DC | PRN
  Administered 2011-06-20 (×2): 500 mL via INTRAVENOUS

## 2011-06-20 MED ORDER — PHENYLEPHRINE 40 MCG/ML (10ML) SYRINGE FOR IV PUSH (FOR BLOOD PRESSURE SUPPORT)
80.0000 ug | PREFILLED_SYRINGE | INTRAVENOUS | Status: DC | PRN
  Filled 2011-06-20 (×2): qty 5

## 2011-06-20 MED ORDER — CITRIC ACID-SODIUM CITRATE 334-500 MG/5ML PO SOLN: 30.0000 mL | ORAL | Status: DC | PRN

## 2011-06-20 MED ORDER — EPHEDRINE 5 MG/ML INJ
10.0000 mg | INTRAVENOUS | Status: DC | PRN
  Filled 2011-06-20 (×2): qty 4

## 2011-06-20 MED ORDER — LIDOCAINE HCL (PF) 1 % IJ SOLN: 30.0000 mL | INTRAMUSCULAR | Status: DC | PRN

## 2011-06-20 MED ORDER — LIDOCAINE HCL 1.5 % IJ SOLN
INTRAMUSCULAR | Status: DC | PRN
  Administered 2011-06-20 (×2): 5 mL via EPIDURAL

## 2011-06-20 MED ORDER — NALBUPHINE SYRINGE 5 MG/0.5 ML
5.0000 mg | INJECTION | INTRAMUSCULAR | Status: DC | PRN
  Administered 2011-06-20 (×2): 10 mg via INTRAVENOUS
  Filled 2011-06-20 (×2): qty 1

## 2011-06-20 MED ORDER — LACTATED RINGERS IV SOLN: 500.0000 mL | Freq: Once | INTRAVENOUS | Status: DC

## 2011-06-20 MED ORDER — FLEET ENEMA 7-19 GM/118ML RE ENEM: 1.0000 | ENEMA | RECTAL | Status: DC | PRN

## 2011-06-20 MED ORDER — SODIUM CHLORIDE 0.9 % IV SOLN
2.0000 g | Freq: Four times a day (QID) | INTRAVENOUS | Status: DC
  Administered 2011-06-20 – 2011-06-22 (×6): 2 g via INTRAVENOUS
  Filled 2011-06-20 (×8): qty 2000

## 2011-06-20 NOTE — Anesthesia Preprocedure Evaluation (Addendum)
Anesthesia Evaluation  Patient identified by MRN, date of birth, ID band Patient awake    Reviewed: Allergy & Precautions, H&P , Patient's Chart, lab work & pertinent test results  Airway Mallampati: II TM Distance: >3 FB Neck ROM: full    Dental No notable dental hx.    Pulmonary neg pulmonary ROS,  clear to auscultation  Pulmonary exam normal       Cardiovascular neg cardio ROS regular Normal    Neuro/Psych Negative Neurological ROS  Negative Psych ROS   GI/Hepatic negative GI ROS, Neg liver ROS,   Endo/Other  Negative Endocrine ROS  Renal/GU negative Renal ROS     Musculoskeletal   Abdominal   Peds  Hematology negative hematology ROS (+)   Anesthesia Other Findings Mild fever... Discussed with OBGYN... Will start ABX prior to epidural placement  Reproductive/Obstetrics (+) Pregnancy                          Anesthesia Physical Anesthesia Plan  ASA: II  Anesthesia Plan: Epidural   Post-op Pain Management:    Induction:   Airway Management Planned:   Additional Equipment:   Intra-op Plan:   Post-operative Plan:   Informed Consent: I have reviewed the patients History and Physical, chart, labs and discussed the procedure including the risks, benefits and alternatives for the proposed anesthesia with the patient or authorized representative who has indicated his/her understanding and acceptance.     Plan Discussed with:   Anesthesia Plan Comments:         Anesthesia Quick Evaluation

## 2011-06-20 NOTE — Progress Notes (Signed)
Comfortable with epidural.  CX still closed/firm/50%.  Questions answered.  Will place 3rd dose of cytotec in 2 hours.

## 2011-06-20 NOTE — H&P (Signed)
Annette Nguyen is a 35 y.o. female presenting at 23.2 weeks for IUFD. Patient presented to [facility] with complaints of decreased fetal movement. Present now for delivery. Pregnancy uncomplicated and denies any fever, illness or trauma. She does endorse recent painful BM and bloating.   Maternal Medical History:  Fetal activity: Perceived fetal activity is none.      OB History    Grav Para Term Preterm Abortions TAB SAB Ect Mult Living   2    1  1    0     Past Medical History  Diagnosis Date  . No pertinent past medical history    Past Surgical History  Procedure Date  . Dilation and curettage of uterus    Family History: family history is not on file. Social History:  reports that she has never smoked. She has never used smokeless tobacco. She reports that she does not drink alcohol or use illicit drugs.  ROS  Dilation: Closed Effacement (%): Thick Station: -2 Exam by:: Montez Morita, RNC Blood pressure 132/78, pulse 85, temperature 98.5 F (36.9 C), temperature source Oral, resp. rate 18, height 5\' 5"  (1.651 m), weight 72.576 kg (160 lb), last menstrual period 01/03/2011, unknown if currently breastfeeding. Maternal Exam:  Introitus: Vagina is negative for discharge.    Physical Exam  Vitals reviewed. Constitutional: She is oriented to person, place, and time. She appears well-developed and well-nourished.  HENT:  Head: Normocephalic and atraumatic.  Cardiovascular: Normal rate and regular rhythm.   No murmur heard. Respiratory: Effort normal and breath sounds normal. No respiratory distress. She has no wheezes.  GI: Soft. There is no tenderness.  Genitourinary: Vagina normal and uterus normal. No vaginal discharge found.  Musculoskeletal: She exhibits no edema and no tenderness.  Neurological: She is alert and oriented to person, place, and time.    Prenatal labs: ABO, Rh: --/--/A POS (04/06 0930) Antibody:  Negative Rubella:   RPR:   NEG  HBsAg:    NEG HIV:   NR GBS:   unknown Integrated screen negative.  Assessment/Plan: 35 yo G2 P0020 presents at 23.2 weeks with IUFD.  -admit to L&D for delivery of fetus, cytotec induction -Check labs: Torch titers, Coag, THS, A1c, DIC panel, CBC to rule out maternal pathology.    Henrick Mcgue 06/20/2011, 2:00 PM

## 2011-06-20 NOTE — Progress Notes (Signed)
No fetal movement, [place] subjective decreased fluid, breech, no FHM, c/w fetal demise. Pt advised and condolences expressed, will refer to [facility] for further mgmt

## 2011-06-20 NOTE — Progress Notes (Signed)
Annette Nguyen is a 35 y.o. G2P0010 at [redacted]w[redacted]d. Subjective: Moderate cramping  Objective: BP 116/57  Pulse 81  Temp(Src) 99 F (37.2 C) (Oral)  Resp 18  Ht 5\' 5"  (1.651 m)  Wt 72.576 kg (160 lb)  BMI 26.63 kg/m2  LMP 01/03/2011  Breastfeeding? Unknown      FHT:  FHR: NA  UC:   UI SVE:   Dilation: Closed Effacement (%): 50 Station: -2 Exam by:: Annette Nguyen, CNM  Labs: Lab Results  Component Value Date   WBC 13.7* 06/20/2011   HGB 12.4 06/20/2011   HCT 35.6* 06/20/2011   MCV 87.9 06/20/2011   PLT 280 06/20/2011   PLT 280 06/20/2011    Assessment / Plan: Induction of labor due to fetal demise, on cytotec. Second dose 400 mcg placed PV  Labor: Early Preeclampsia:  NA Fetal Wellbeing:  NA Pain Control:  Nubain I/D:  n/a Anticipated MOD:  NSVD  Annette Nguyen 06/20/2011, 8:15 PM

## 2011-06-20 NOTE — Progress Notes (Signed)
Patient is having decreased fetal movement that started yesterday morning accompanied by severe bloating.  She also had painful bowel movements, not diarrhea.

## 2011-06-20 NOTE — Anesthesia Procedure Notes (Signed)

## 2011-06-21 ENCOUNTER — Other Ambulatory Visit: Payer: Self-pay | Admitting: Obstetrics & Gynecology

## 2011-06-21 ENCOUNTER — Encounter (HOSPITAL_COMMUNITY): Payer: Self-pay | Admitting: *Deleted

## 2011-06-21 DIAGNOSIS — O321XX0 Maternal care for breech presentation, not applicable or unspecified: Secondary | ICD-10-CM

## 2011-06-21 DIAGNOSIS — O364XX0 Maternal care for intrauterine death, not applicable or unspecified: Secondary | ICD-10-CM

## 2011-06-21 LAB — HSV 1 ANTIBODY, IGG: HSV 1 Glycoprotein G Ab, IgG: 0.12 IV

## 2011-06-21 LAB — CBC
Hemoglobin: 12.2 g/dL (ref 12.0–15.0)
MCH: 30.3 pg (ref 26.0–34.0)
MCHC: 34.4 g/dL (ref 30.0–36.0)

## 2011-06-21 LAB — RPR: RPR Ser Ql: NONREACTIVE

## 2011-06-21 MED ORDER — SODIUM CHLORIDE 0.9 % IV SOLN
250.0000 mL | INTRAVENOUS | Status: DC
  Administered 2011-06-21: 250 mL via INTRAVENOUS

## 2011-06-21 MED ORDER — GENTAMICIN SULFATE 40 MG/ML IJ SOLN
Freq: Three times a day (TID) | INTRAMUSCULAR | Status: DC
  Administered 2011-06-21 – 2011-06-22 (×2): via INTRAVENOUS
  Filled 2011-06-21 (×3): qty 2.5

## 2011-06-21 MED ORDER — OXYCODONE-ACETAMINOPHEN 5-325 MG PO TABS
2.0000 | ORAL_TABLET | ORAL | Status: DC | PRN
  Administered 2011-06-21: 2 via ORAL
  Filled 2011-06-21: qty 2

## 2011-06-21 MED ORDER — OXYTOCIN 10 UNIT/ML IJ SOLN
INTRAMUSCULAR | Status: AC
  Filled 2011-06-21: qty 2

## 2011-06-21 MED ORDER — MISOPROSTOL 200 MCG PO TABS
400.0000 ug | ORAL_TABLET | Freq: Once | ORAL | Status: AC
  Administered 2011-06-21: 400 ug via RECTAL
  Filled 2011-06-21: qty 2

## 2011-06-21 MED ORDER — MISOPROSTOL 200 MCG PO TABS: 400.0000 ug | ORAL_TABLET | Freq: Once | ORAL | Status: DC

## 2011-06-21 MED ORDER — ACETAMINOPHEN 325 MG PO TABS
650.0000 mg | ORAL_TABLET | Freq: Once | ORAL | Status: AC
  Administered 2011-06-21: 650 mg via ORAL

## 2011-06-21 MED ORDER — IBUPROFEN 600 MG PO TABS
600.0000 mg | ORAL_TABLET | Freq: Four times a day (QID) | ORAL | Status: DC | PRN
  Administered 2011-06-21 (×4): 600 mg via ORAL
  Filled 2011-06-21: qty 1

## 2011-06-21 MED ORDER — ACETAMINOPHEN 325 MG PO TABS
650.0000 mg | ORAL_TABLET | Freq: Four times a day (QID) | ORAL | Status: DC | PRN
  Administered 2011-06-21: 650 mg via ORAL

## 2011-06-21 MED ORDER — SODIUM CHLORIDE 0.9 % IJ SOLN: 3.0000 mL | Freq: Two times a day (BID) | INTRAMUSCULAR | Status: DC

## 2011-06-21 MED ORDER — ZOLPIDEM TARTRATE 10 MG PO TABS
10.0000 mg | ORAL_TABLET | Freq: Every evening | ORAL | Status: DC | PRN
  Filled 2011-06-21: qty 1

## 2011-06-21 MED ORDER — SODIUM BICARBONATE 8.4 % IV SOLN
INTRAVENOUS | Status: DC | PRN
  Administered 2011-06-21: 3 mL via EPIDURAL

## 2011-06-21 MED ORDER — SODIUM CHLORIDE 0.9 % IJ SOLN: 3.0000 mL | INTRAMUSCULAR | Status: DC | PRN

## 2011-06-21 MED ORDER — OXYTOCIN 10 UNIT/ML IJ SOLN: 10.0000 [IU] | Freq: Once | INTRAMUSCULAR | Status: DC

## 2011-06-21 MED ORDER — CLINDAMYCIN PHOSPHATE 900 MG/50ML IV SOLN
900.0000 mg | Freq: Once | INTRAVENOUS | Status: AC
  Administered 2011-06-21: 900 mg via INTRAVENOUS
  Filled 2011-06-21: qty 50

## 2011-06-21 NOTE — Progress Notes (Signed)
Baby taken to utility room to perform tissue sampling for genetic testing.  This performed by Dr. Penne Lash. Form/tissue sent to lab.

## 2011-06-21 NOTE — Progress Notes (Signed)
CX softer, FT/75%.  4th dose of cytotec placed in PV.  Pt comfortable with epidural.

## 2011-06-21 NOTE — Progress Notes (Signed)
Placenta remains undelivered. 

## 2011-06-21 NOTE — Progress Notes (Signed)
Annette Nguyen is a 35 y.o. G2P0110 at [redacted]w[redacted]d s/p delivery  Labs: Lab Results  Component Value Date   WBC 13.7* 06/20/2011   HGB 12.4 06/20/2011   HCT 35.6* 06/20/2011   MCV 87.9 06/20/2011   PLT 280 06/20/2011   PLT 280 06/20/2011    Pt examined at 10 am.  Edge of placenta can be felt.  Will give 400 mcg of cytotec per rectum.  Pt febrile at 102.1.  Will start clindamycin 900 mg q8 hrs.  Will reexamine in 1 hour after receiving cytotec.

## 2011-06-21 NOTE — Progress Notes (Signed)
UR Chart review completed.  

## 2011-06-21 NOTE — H&P (Signed)
Attestation of Attending Supervision of Resident: Evaluation and management procedures were performed by the [facility] Resident under my supervision.  I have reviewed the resident's note, chart reviewed and agree with management and plan.  Darrill Vreeland A 06/21/2011 1:22 PM

## 2011-06-21 NOTE — Progress Notes (Signed)
Annette Nguyen, CNM in room. Placenta remains in intact.

## 2011-06-21 NOTE — Progress Notes (Signed)
  Patient and husband have questions about autopsy. We discussed the option and information that can be gained by doing autopsy. Parents have decided not to go forward with this.  Placenta remains in uterus. Attempted delivery with cord traction and pushing to no avail. No active hemorrhage. Dr Penne Lash updated.  Antibiotics amended due to persistent fever.

## 2011-06-21 NOTE — Progress Notes (Signed)
Dr. Penne Lash as bedside evaluating patient and examining for placental separation.  Orders received.

## 2011-06-21 NOTE — Anesthesia Postprocedure Evaluation (Signed)
  Anesthesia Post-op Note  Patient: Annette Nguyen  Procedure(s) Performed: * No procedures listed *  Patient Location: [facility]'s Unit  Anesthesia Type: Epidural  Level of Consciousness: alert  and oriented  Airway and Oxygen Therapy: Patient Spontanous Breathing  Post-op Pain: mild  Post-op Assessment: Patient's Cardiovascular Status Stable and Respiratory Function Stable  Post-op Vital Signs: stable  Complications: No apparent anesthesia complications

## 2011-06-21 NOTE — Progress Notes (Signed)
Late entry:  The placenta had not delivered at 11 a.m.  The epidural was bolused.  Maunual removal of the placenta was completed.  After the placenta delivered 2 swipes were made to ensure that all membranes and products of conception were removed.  No fibroids or septum felt.  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  I think an MRI would be warren ted 6-8 weeks post partum.    Pt would like genetics sent on baby.  A portion of the achilles tendon and patella were removed and place in tissue medium.  Specimen to be sent to [facility].  Pt to go to 3rd floor for post partum care.  Bernardino Dowell H..

## 2011-06-22 DIAGNOSIS — O364XX0 Maternal care for intrauterine death, not applicable or unspecified: Secondary | ICD-10-CM | POA: Diagnosis present

## 2011-06-22 LAB — CMV ANTIBODY, IGG (EIA): CMV Ab - IgG: >6 — ABNORMAL HIGH (ref <0.90)

## 2011-06-22 MED ORDER — DOCUSATE SODIUM 100 MG PO CAPS: 100.0000 mg | ORAL_CAPSULE | Freq: Two times a day (BID) | ORAL | Status: AC | PRN

## 2011-06-22 MED ORDER — OXYCODONE-ACETAMINOPHEN 5-325 MG PO TABS: 1.0000 | ORAL_TABLET | Freq: Four times a day (QID) | ORAL | Status: AC | PRN

## 2011-06-22 MED ORDER — IBUPROFEN 600 MG PO TABS: 600.0000 mg | ORAL_TABLET | Freq: Four times a day (QID) | ORAL | Status: AC | PRN

## 2011-06-22 MED ORDER — DOCUSATE SODIUM 100 MG PO CAPS: 100.0000 mg | ORAL_CAPSULE | Freq: Two times a day (BID) | ORAL | Status: DC | PRN

## 2011-06-22 MED ORDER — IBUPROFEN 600 MG PO TABS: 600.0000 mg | ORAL_TABLET | Freq: Four times a day (QID) | ORAL | Status: DC | PRN

## 2011-06-22 MED ORDER — ZOLPIDEM TARTRATE 10 MG PO TABS: 10.0000 mg | ORAL_TABLET | Freq: Every evening | ORAL | Status: AC | PRN

## 2011-06-22 NOTE — Progress Notes (Signed)
Spiritual Care - Referral from patient's nurse to provide grief support.  Patient was preparing for discharge - very tearful.  Husband rather quiet.  Her parents were also visiting and giving support.  Couple have a pastor who will assist in arrangements for [employer].  Patient has a Education officer, community.  Reviewed grief resources available.  Dory Horn

## 2011-06-22 NOTE — Progress Notes (Signed)
Admission nutrition screen triggered. Patients chart reviewed and assessed  for nutritional risk. Patient is determined to be at low nutrition  risk.  

## 2011-06-22 NOTE — Discharge Summary (Signed)
Obstetric Discharge Summary Reason for Admission: Induction of labor for intrauterine fetal demise at [redacted]w[redacted]d Prenatal Procedures: None Intrapartum Procedures: spontaneous vaginal delivery Postpartum Procedures: antibiotics Complications-Operative and Postpartum: fever to 102 F, treated with antibiotics  Laboratory Studies:  Results for orders placed during the hospital encounter of 06/20/11 (from the past 72 hour(s))  STREP B DNA PROBE     Status: Normal      Component Value Range Comment   Group B Strep Ag Unknown     CBC     Status: Abnormal   Collection Time   06/20/11  2:50 PM      Component Value Range Comment   WBC 13.7 (*) 4.0 - 10.5 (K/uL)    RBC 4.05  3.87 - 5.11 (MIL/uL)    Hemoglobin 12.4  12.0 - 15.0 (g/dL)    HCT 16.1 (*) 09.6 - 46.0 (%)    MCV 87.9  78.0 - 100.0 (fL)    MCH 30.6  26.0 - 34.0 (pg)    MCHC 34.8  30.0 - 36.0 (g/dL)    RDW 04.5  40.9 - 81.1 (%)    Platelets 280  150 - 400 (K/uL)   DIFFERENTIAL     Status: Abnormal   Collection Time   06/20/11  2:50 PM      Component Value Range Comment   Neutrophils Relative 76  43 - 77 (%)    Neutro Abs 10.4 (*) 1.7 - 7.7 (K/uL)    Lymphocytes Relative 18  12 - 46 (%)    Lymphs Abs 2.4  0.7 - 4.0 (K/uL)    Monocytes Relative 6  3 - 12 (%)    Monocytes Absolute 0.8  0.1 - 1.0 (K/uL)    Eosinophils Relative 0  0 - 5 (%)    Eosinophils Absolute 0.1  0.0 - 0.7 (K/uL)    Basophils Relative 0  0 - 1 (%)    Basophils Absolute 0.0  0.0 - 0.1 (K/uL)   RPR     Status: Normal   Collection Time   06/20/11  2:50 PM      Component Value Range Comment   RPR NON REACTIVE  NON REACTIVE    RUBELLA SCREEN     Status: Abnormal   Collection Time   06/20/11  2:50 PM      Component Value Range Comment   Rubella >500.0 (*)    TOXOPLASMA GONDII ANTIBODY, IGG     Status: Normal   Collection Time   06/20/11  2:50 PM      Component Value Range Comment   Toxoplasma IgG Ratio <0.5  <6.4 (IU/mL)   CMV ANTIBODY, IGG (EIA)     Status:  Abnormal   Collection Time   06/20/11  2:50 PM      Component Value Range Comment   CMV Ab - IgG >6.00 (*) <0.90    HSV 1 ANTIBODY, IGG     Status: Normal   Collection Time   06/20/11  2:50 PM      Component Value Range Comment   HSV 1 Glycoprotein G Ab, IgG 0.12     HSV 2 ANTIBODY, IGG     Status: Abnormal   Collection Time   06/20/11  2:50 PM      Component Value Range Comment   HSV 2 Glycoprotein G Ab, IgG 7.68 (*)    DIC PANEL     Status: Abnormal   Collection Time   06/20/11  2:50 PM  Component Value Range Comment   Prothrombin Time 13.1  11.6 - 15.2 (seconds)    INR 0.97  0.00 - 1.49     aPTT 27  24 - 37 (seconds)    Fibrinogen 428  204 - 475 (mg/dL)    D-Dimer, Quant 4.09 (*) 0.00 - 0.48 (ug/mL-FEU)    Platelets 280  150 - 400 (K/uL)    Smear Review NO SCHISTOCYTES SEEN     TSH     Status: Normal   Collection Time   06/20/11  2:50 PM      Component Value Range Comment   TSH 1.983  0.350 - 4.500 (uIU/mL)   HEMOGLOBIN A1C     Status: Normal   Collection Time   06/20/11  2:50 PM      Component Value Range Comment   Hemoglobin A1C 5.1  <5.7 (%)    Mean Plasma Glucose 100  <117 (mg/dL)   URINE RAPID DRUG SCREEN (HOSP PERFORMED)     Status: Normal   Collection Time   06/20/11  7:40 PM      Component Value Range Comment   Opiates NONE DETECTED  NONE DETECTED     Cocaine NONE DETECTED  NONE DETECTED     Benzodiazepines NONE DETECTED  NONE DETECTED     Amphetamines NONE DETECTED  NONE DETECTED     Tetrahydrocannabinol NONE DETECTED  NONE DETECTED     Barbiturates NONE DETECTED  NONE DETECTED    CBC     Status: Abnormal   Collection Time   06/21/11 12:30 PM      Component Value Range Comment   WBC 16.2 (*) 4.0 - 10.5 (K/uL)    RBC 4.03  3.87 - 5.11 (MIL/uL)    Hemoglobin 12.2  12.0 - 15.0 (g/dL)    HCT 81.1 (*) 91.4 - 46.0 (%)    MCV 88.1  78.0 - 100.0 (fL)    MCH 30.3  26.0 - 34.0 (pg)    MCHC 34.4  30.0 - 36.0 (g/dL)    RDW 78.2  95.6 - 21.3 (%)    Platelets 229   150 - 400 (K/uL)     Discharge Diagnoses: SVD of nonviable fetus after IOL with high dose misoprostol  Reason for Admission: Patient is a 35 y.o. G2P0010 at [redacted]w[redacted]d who was evaluated in the [facility] on 06/20/2011 for decreased fetal movement and was found to have an intrauterine fetal demise.  Patient was sent to the hospital for IOL with high-dose misoprostol.  During her induction, she had a fever of 36 F and was treated with antibiotics.  She eventually had a SVD of a nonviable female infant; please see delivery note for more details.  She had an uncomplicated postpartum stay and appropriate support was given to patient.  She was discharged to home on PPD#1 as per the patient's request.  Discharge Information: Date: 06/22/2011 Activity: unrestricted and pelvic rest Diet: routine Medications: Ibuprofen, Colace, Percocet and Ambien Condition: stable Discharge to: home Follow-up Information    Follow up with [facility]. Call in 2 weeks.   Contact information:   [address] [place] [address]        Newborn Data: Live born female  Birth Weight:  APGAR: 0, 0 Arrangements made with funeral home for disposition.  Michalina Calbert A 06/22/2011, 3:22 PM

## 2011-06-22 NOTE — Progress Notes (Signed)
Post Partum Day 1 Subjective: no complaints, up ad lib, voiding, tolerating PO, + flatus and appropriately grieving.  Objective: Blood pressure 90/53, pulse 65, temperature 98.2 F (36.8 C), temperature source Oral, resp. rate 18, height 5\' 5"  (1.651 m), weight 72.576 kg (160 lb), last menstrual period 01/03/2011, SpO2 99.00%, unknown if currently breastfeeding.  Physical Exam:  General: alert and no distress Lochia: appropriate Uterine Fundus: firm DVT Evaluation: No evidence of DVT seen on physical exam. Negative Homan's sign.   Basename 06/21/11 1230 06/20/11 1450  HGB 12.2 12.4  HCT 35.5* 35.6*    Assessment/Plan: Discharge home Appropriate support given; patient aware of [facility] and other resources Follow up at [facility] in 2 weeks.   LOS: 2 days   Hue Steveson A 06/22/2011, 3:36 PM

## 2011-06-26 ENCOUNTER — Ambulatory Visit (INDEPENDENT_AMBULATORY_CARE_PROVIDER_SITE_OTHER): Payer: 59 | Admitting: Obstetrics & Gynecology

## 2011-06-26 ENCOUNTER — Encounter: Payer: Self-pay | Admitting: Obstetrics & Gynecology

## 2011-06-26 VITALS — BP 119/83 | HR 70 | Ht 65.0 in | Wt 156.0 lb

## 2011-06-26 DIAGNOSIS — O9229 Other disorders of breast associated with pregnancy and the puerperium: Secondary | ICD-10-CM

## 2011-06-26 NOTE — Progress Notes (Signed)
Patient is here today because Saturday she noticed increasing discomfort in her left breast along with some lumps that she is concerned about and would like to have this checked out.  She had a fetal demise last week.  Please review bloodwork that has returned.  Advised patient that chromosome studies were still processing.

## 2011-06-26 NOTE — Patient Instructions (Signed)
Breast Tenderness Breast tenderness is a common complaint made by women of all ages. It is also called mastalgia or mastodynia, which means breast pain. The condition can range from mild discomfort to severe pain. It has a variety of causes. Your caregiver will find out the likely cause of your breast tenderness by examining your breasts, asking you about symptoms and perhaps ordering some tests. Breast tenderness usually does not mean you have breast cancer. CAUSES  Breast tenderness has many possible causes. They include:  Premenstrual changes. A week to 10 days before your period, your breasts might ache or feel tender.   Other hormonal causes. These include:   When sexual and physical traits mature (puberty).   Pregnancy.   The time right before and the year after menopause (perimenopause).   The day when it has been 12 months since your last period (menopause).   Large breasts.   Infection (also called mastitis).   Birth control pills.   Tenderness can occur if the breasts are overfull with milk or if a milk duct is blocked.   Injury.   Fibrocystic breast changes. This is not cancer (benign). It causes painful breasts that feel lumpy.   Fluid-filled sacs (cysts). Often cysts can be drained in your healthcare provider's office.   Fibroadenoma. This is a tumor that is not cancerous.   Medication side effects. Blood pressure drugs and diuretics (which increase urine flow) sometimes cause breast tenderness.   Previous breast surgery, such as a breast reduction.   Breast cancer. Cancer is rarely the reason breasts are tender. In most women, tenderness is caused by something else.  DIAGNOSIS  Several methods can be used to find out why your breasts are tender. They include:  Visual inspection of the breasts.   Examination by hand.   Tests, such as:   Mammogram.   Ultrasound.   Biopsy.   Lab test of any fluid coming from the nipple.   Blood tests.   MRI.    TREATMENT  Treatment is directed to the cause of the breast tenderness from doing nothing for minor discomfort, wearing a good support bra but also may include:  Taking over-the-counter medicines for pain or discomfort as directed by your caregiver.   Prescription medicine for breast tenderness related to:   Premenstrual.   Fibrocystic.   Puberty.   Pregnancy.   Menopause.   Previous breast surgery.   Large breasts.   Antibiotics for infection.   Birth control pills for fibrocystic and premenstrual changes.   Cold and warm compresses and a good support bra for most breast injuries.   Breast cysts are sometimes drained with a needle (aspiration) or removed with minor surgery.   Fibroadenomas are usually removed with minor surgery.   Changing or stopping the medicine when it is responsible for causing the breast tenderness.   When breast cancer is present with or without causing pain, it is usually treated with major surgery (with or without radiation) and chemotherapy.  HOME CARE INSTRUCTIONS  Breast tenderness often can be handled at home. You can try:  Getting fitted for a new bra that provides more support, especially during exercise.   Wearing a more supportive or sports bra while sleeping when your breasts are very tender.   IApplying a warm compress for relief.   Taking over-the-counter pain relievers, if this is OK with your caregiver.   Taking medicine that your caregiver prescribes. These might include antibiotics or birth control pills.  Also, learn how  to do breast examinations at home. This will help you tell when you have an unusual growth or lump that could cause tenderness. And keep a log of the days and times when your breasts are most tender. This will help you and your caregiver find the right solution. SEEK MEDICAL CARE IF:   Any part of your breast is hard, red and hot to the touch. This could be a sign of infection.   Fluid is coming out of  your nipples (and you are not breastfeeding). Especially watch for blood or pus.   You have a fever as well as breast tenderness.   You have a new or painful lump in your breast that remains after your period ends.   You have tried to take care of the pain at home, but it has not gone away.   Your breast pain is getting worse. Or, the pain is making it hard to do the things you usually do during your day.  Document Released: 07/12/2008 Document Revised: 04/11/2011 Document Reviewed: 07/12/2008 [facility] Patient Information 2012 [place], [place].

## 2011-06-27 NOTE — Progress Notes (Signed)
History:  35 y.o. G2P0110 here today for left breast tenderness. She had fetal demise and SVD at 23 weeks on 06/21/11 and was discharged home the next day.  She reports having left breast tenderness and lumps and wants to be evaluated.  The following portions of the patient's history were reviewed and updated as appropriate: allergies, current medications, past family history, past medical history, past social history, past surgical history and problem list.   Objective:  Physical Exam Blood pressure 119/83, pulse 70, height 5\' 5"  (1.651 m), weight 156 lb (70.761 kg), not currently breastfeeding. Gen: NAD, sad affect Breasts: Bilateral breast fullness and enlarged ducts palpated diffusely.  No erythema, no skin changes or discharge. Abd: Soft, nontender and nondistended Pelvic: Normal appearing external genitalia; normal appearing vaginal mucosa and cervix.  Normal discharge.  Small uterus, no palpable masses or adnexal tenderness.  Assessment & Plan:  Postpartum mastodynia secondary to enlarged milk ducts.  Patient reassured, encouraged to use warm compresses and cabbage leaves among other remedies.  Of note, patient was offered some medication for her mood but she declined. -She was told to call back if she has erythema or any worsening symptoms.  She will come back in one week for reevaluation.

## 2011-07-02 ENCOUNTER — Encounter: Payer: 59 | Admitting: Obstetrics & Gynecology

## 2011-07-03 LAB — TORCH-IGM(TOXO/ RUB/ CMV/ HSV) W TITER: Rubella IgM Index: <0.9

## 2011-07-04 ENCOUNTER — Encounter: Payer: Self-pay | Admitting: Obstetrics & Gynecology

## 2011-07-04 ENCOUNTER — Ambulatory Visit (INDEPENDENT_AMBULATORY_CARE_PROVIDER_SITE_OTHER): Payer: 59 | Admitting: Obstetrics & Gynecology

## 2011-07-04 VITALS — BP 117/74 | HR 74 | Ht 65.0 in | Wt 155.0 lb

## 2011-07-04 DIAGNOSIS — O364XX0 Maternal care for intrauterine death, not applicable or unspecified: Secondary | ICD-10-CM

## 2011-07-04 DIAGNOSIS — M549 Dorsalgia, unspecified: Secondary | ICD-10-CM

## 2011-07-04 DIAGNOSIS — Q519 Congenital malformation of uterus and cervix, unspecified: Secondary | ICD-10-CM

## 2011-07-04 MED ORDER — OXYCODONE-ACETAMINOPHEN 5-325 MG PO TABS: 1.0000 | ORAL_TABLET | ORAL | Status: AC | PRN

## 2011-07-04 MED ORDER — CYCLOBENZAPRINE HCL 10 MG PO TABS: 10.0000 mg | ORAL_TABLET | Freq: Three times a day (TID) | ORAL | Status: AC | PRN

## 2011-07-04 MED ORDER — CYCLOBENZAPRINE HCL 10 MG PO TABS: 10.0000 mg | ORAL_TABLET | Freq: Three times a day (TID) | ORAL | Status: DC | PRN

## 2011-07-04 NOTE — Progress Notes (Signed)
Patient is here today for hospital follow up.  She is still having significant back pain and the Percocet seems to be the only thing that is helping.  She would like a refill of this.  She is taking the Ambien to help her sleep and it is helping.

## 2011-07-04 NOTE — Progress Notes (Signed)
History:    35 y.o. G2P0110 here today for follow up. She had fetal demise and SVD at 23 weeks on 06/21/11 and was discharged home the next day.  She was last seen for breast tenderness about a week ago, that has resolved.  She wants MRI scheduled for evaluation of her uterus for possible uterine anomaly.  Patient declines any medication; she will go to talk to grief counselor soon.  The following portions of the patient's history were reviewed and updated as appropriate: allergies, current medications, past family history, past medical history, past social history, past surgical history and problem list.   Objective:    Blood pressure 117/74, pulse 74, height 5\' 5"  (1.651 m), weight 155 lb (70.308 kg). 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 - PLACENTAL THROMBUS. - ACUTE VILLOUS EDEMA.  Chromosome Analysis  Still in Process  Assessment & Plan:    Will schedule MRI in 2 weeks, give more time for uterine involution Will also have patient RTC in 4 weeks, for discussion of results and thrombophilia workup given placental thrombus Appropriate support given to patient. Discuss contraception next visit; patient to have pelvic rest for 6 weeks after delivery

## 2011-07-11 LAB — CHROMOSOME STD, POC(TISSUE)-NCBH

## 2011-07-14 DEATH — deceased

## 2011-07-18 ENCOUNTER — Other Ambulatory Visit (HOSPITAL_COMMUNITY): Payer: 59

## 2011-07-20 ENCOUNTER — Inpatient Hospital Stay (HOSPITAL_COMMUNITY): Admission: RE | Admit: 2011-07-20 | Payer: 59 | Source: Ambulatory Visit

## 2011-07-20 ENCOUNTER — Ambulatory Visit (HOSPITAL_COMMUNITY)
Admission: RE | Admit: 2011-07-20 | Discharge: 2011-07-20 | Disposition: A | Discharge status: Home / Self Care | Payer: 59 | Source: Ambulatory Visit | Attending: Obstetrics & Gynecology | Admitting: Obstetrics & Gynecology

## 2011-07-20 DIAGNOSIS — D252 Subserosal leiomyoma of uterus: Secondary | ICD-10-CM | POA: Insufficient documentation

## 2011-07-20 DIAGNOSIS — Q5122 Other partial doubling of uterus: Secondary | ICD-10-CM | POA: Insufficient documentation

## 2011-07-20 DIAGNOSIS — O364XX0 Maternal care for intrauterine death, not applicable or unspecified: Secondary | ICD-10-CM

## 2011-07-20 DIAGNOSIS — Q5128 Other doubling of uterus, other specified: Secondary | ICD-10-CM | POA: Insufficient documentation

## 2011-07-20 DIAGNOSIS — Q519 Congenital malformation of uterus and cervix, unspecified: Secondary | ICD-10-CM

## 2011-07-20 MED ORDER — GADOBENATE DIMEGLUMINE 529 MG/ML IV SOLN
14.0000 mL | Freq: Once | INTRAVENOUS | Status: AC | PRN
  Administered 2011-07-20: 14 mL via INTRAVENOUS

## 2011-07-21 ENCOUNTER — Other Ambulatory Visit (HOSPITAL_COMMUNITY): Payer: 59

## 2011-07-24 ENCOUNTER — Encounter: Payer: Self-pay | Admitting: Obstetrics & Gynecology

## 2011-07-24 ENCOUNTER — Ambulatory Visit: Payer: 59 | Admitting: Obstetrics and Gynecology

## 2011-07-24 ENCOUNTER — Ambulatory Visit (INDEPENDENT_AMBULATORY_CARE_PROVIDER_SITE_OTHER): Payer: 59 | Admitting: Obstetrics and Gynecology

## 2011-07-24 VITALS — BP 109/73 | HR 80 | Ht 65.0 in | Wt 154.0 lb

## 2011-07-24 DIAGNOSIS — O364XX0 Maternal care for intrauterine death, not applicable or unspecified: Secondary | ICD-10-CM

## 2011-07-24 MED ORDER — DIPHENOXYLATE-ATROPINE 2.5-0.025 MG PO TABS: 1.0000 | ORAL_TABLET | Freq: Four times a day (QID) | ORAL | Status: DC | PRN

## 2011-07-25 ENCOUNTER — Inpatient Hospital Stay: Payer: Self-pay | Admitting: Otolaryngology

## 2011-07-25 ENCOUNTER — Telehealth: Payer: Self-pay | Admitting: *Deleted

## 2011-07-25 DIAGNOSIS — K589 Irritable bowel syndrome without diarrhea: Secondary | ICD-10-CM

## 2011-07-25 MED ORDER — DIPHENOXYLATE-ATROPINE 2.5-0.025 MG PO TABS: 1.0000 | ORAL_TABLET | Freq: Four times a day (QID) | ORAL | Status: AC | PRN

## 2011-07-25 MED ORDER — DIPHENOXYLATE-ATROPINE 2.5-0.025 MG PO TABS: 1.0000 | ORAL_TABLET | Freq: Four times a day (QID) | ORAL | Status: DC | PRN

## 2011-07-25 NOTE — Progress Notes (Signed)
  Subjective:    Patient ID: Annette Nguyen, female    DOB: 1976-02-28, 35 y.o.   MRN: [id number]  HPI G2P0110 now 1 month s/p SVD nonviable 23 wk fetus after misoprostol IOL. Plac path significant for thrombus; cell culture did not grow. The couple was expecting to talk with surgeon regarding any surgical options based on MRI results which I reviewed with them today (subseptate uterus and several small fibroids). They want genetic testing (presumably for carrier status). She wants to return to work 08/02/11. She has not had thrombophilia work up yet. She has been having diarrhea several times a day unresponsive to Imodium, otherwise is physically OK and is grieving her loss.  Husband is unhappy that cell culture was "botched" and they are seeing the wrong person today; asks not to be charged for the visit. He does not want genetic" counseling" just a blood test.    Review of Systems See above.    Objective:   Physical Exam  Deferred. Affect appropriate.       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 Return if blood in diarrhea, fever or any systemic sx's. Appt with [facility] Genetic Counseling Return to work letter

## 2011-07-25 NOTE — Telephone Encounter (Signed)
Sent RX to pharmacy as it would not send electronic.

## 2011-07-31 ENCOUNTER — Ambulatory Visit: Payer: 59 | Admitting: Obstetrics and Gynecology

## 2011-08-09 ENCOUNTER — Encounter (HOSPITAL_COMMUNITY): Payer: 59

## 2011-08-09 ENCOUNTER — Ambulatory Visit (HOSPITAL_COMMUNITY)
Admission: RE | Admit: 2011-08-09 | Discharge: 2011-08-09 | Disposition: A | Discharge status: Home / Self Care | Payer: 59 | Source: Ambulatory Visit | Attending: Obstetrics & Gynecology | Admitting: Obstetrics & Gynecology

## 2011-08-15 ENCOUNTER — Other Ambulatory Visit: Payer: Self-pay

## 2011-09-17 ENCOUNTER — Encounter: Payer: Self-pay | Admitting: Maternal and Fetal Medicine

## 2011-10-11 ENCOUNTER — Encounter: Payer: Self-pay | Admitting: Maternal & Fetal Medicine

## 2012-10-22 IMAGING — US US THYROID
1 series · 17 of 25 positions shown · non-contrast
Comparison: none

REASON FOR EXAM: thyroid nodule
COMMENTS:

[Series 1: us thyroid · 17 of 27 slices shown]
[im 1/27]
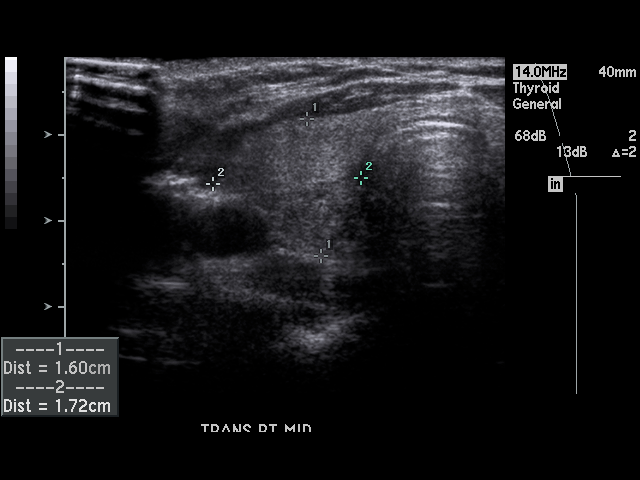
[im 3/27]
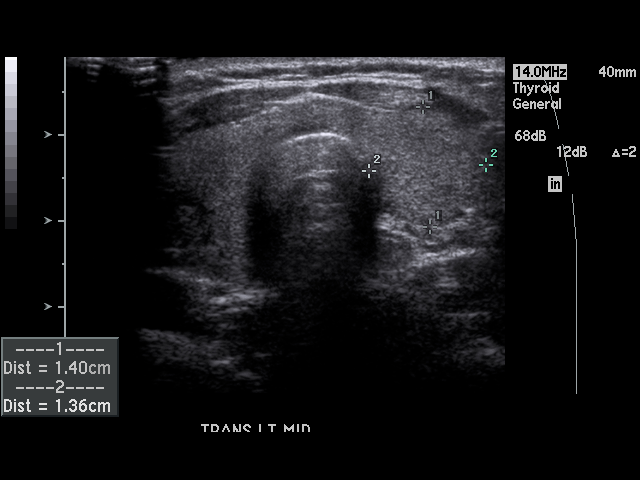
[im 4/27]
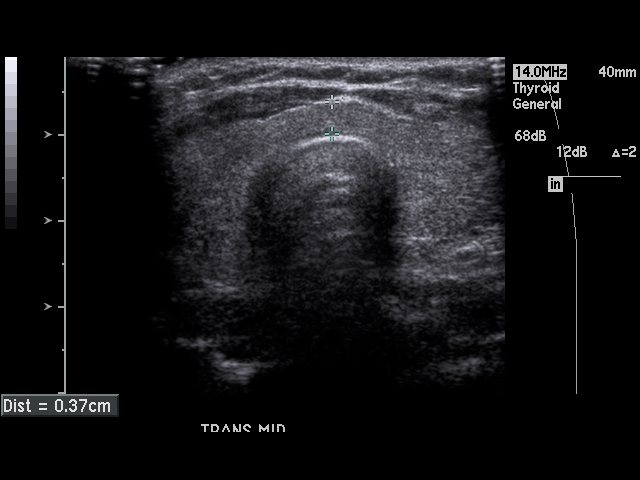
[im 6/27]
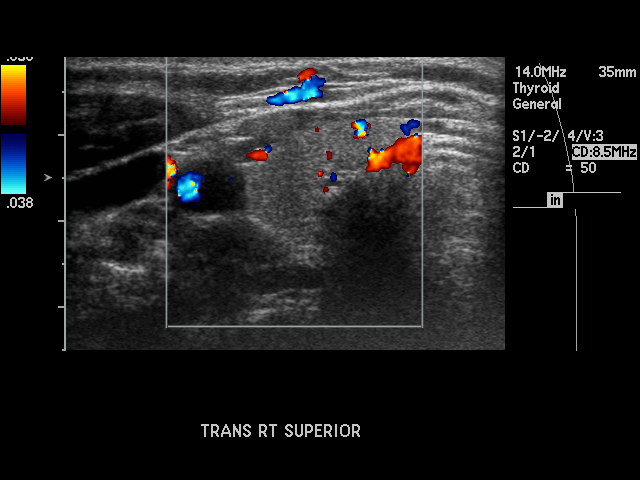
[im 7/27]
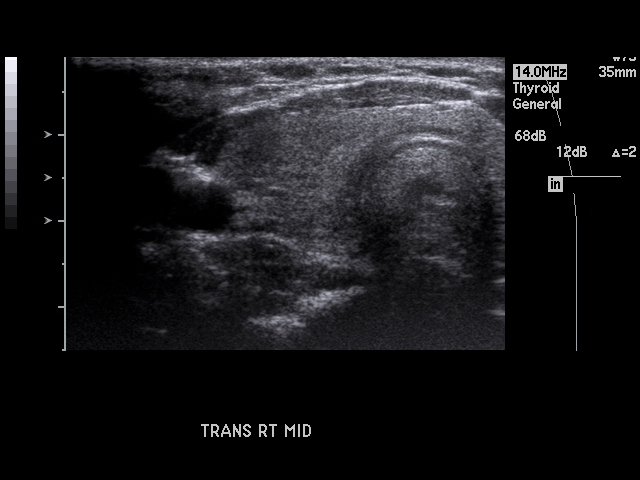
[im 9/27]
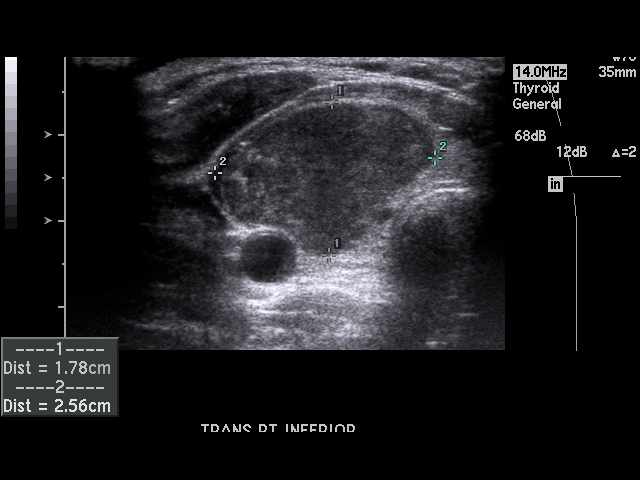
[im 10/27]
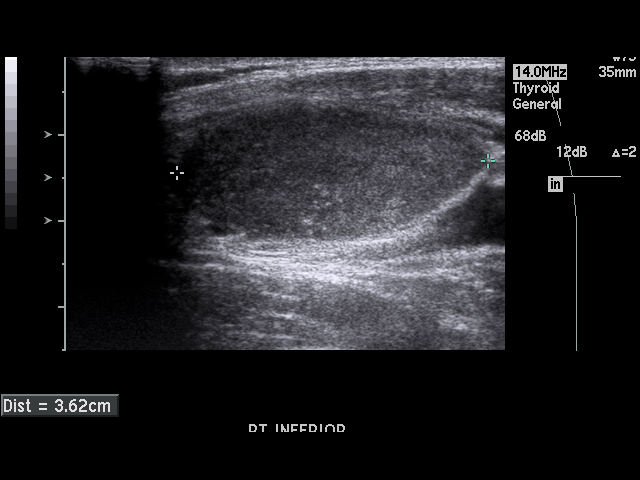
[im 12/27]
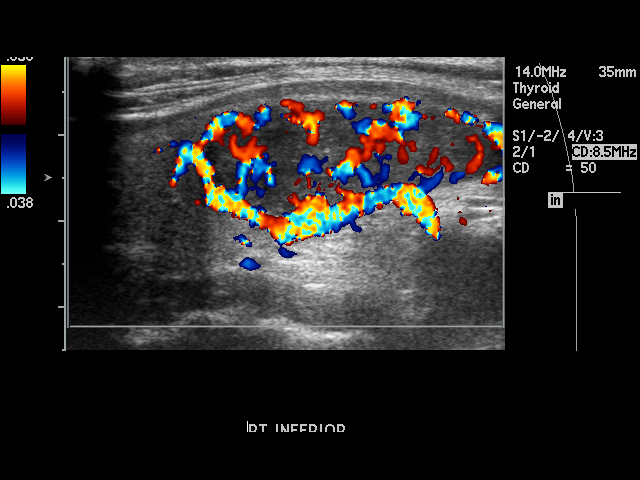
[im 14/27]
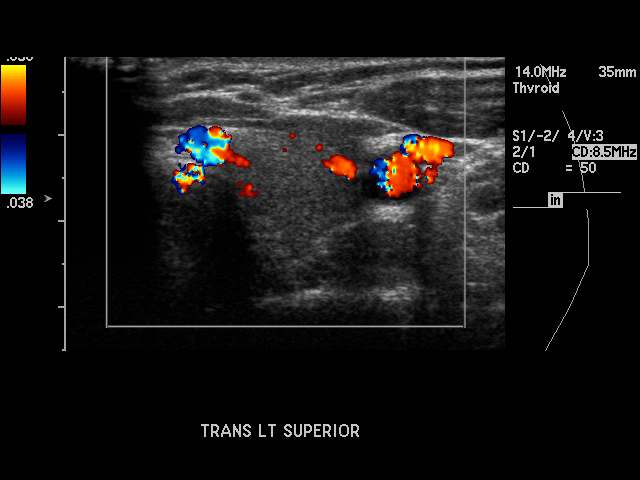
[im 15/27]
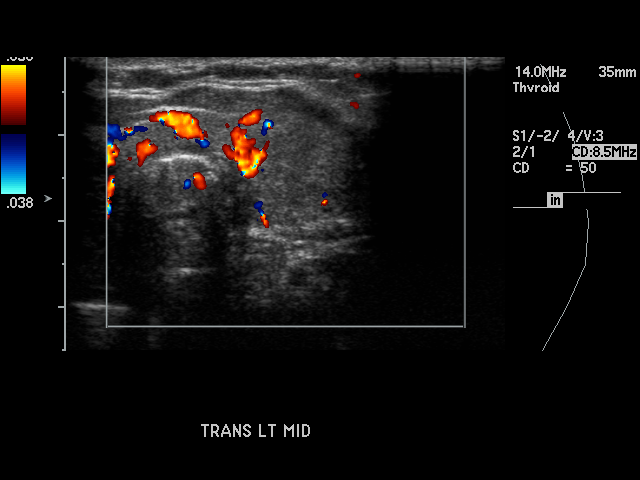
[im 17/27]
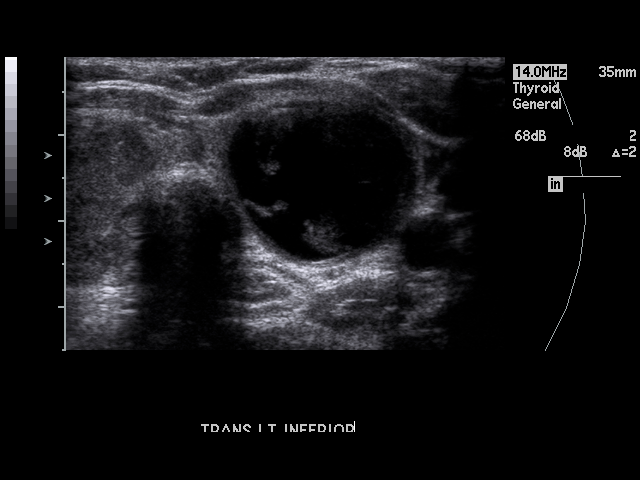
[im 18/27]
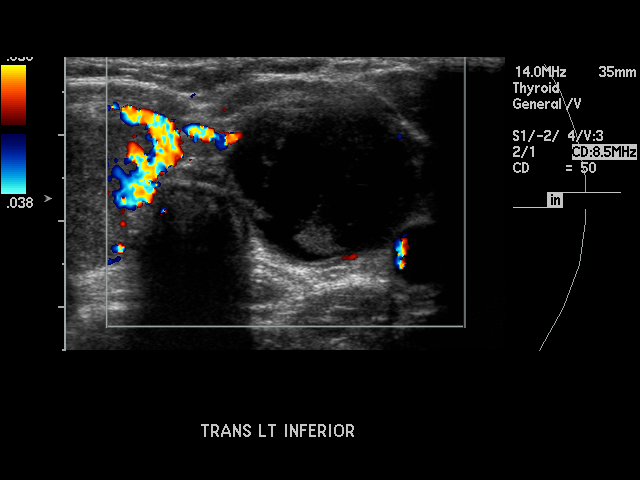
[im 20/27]
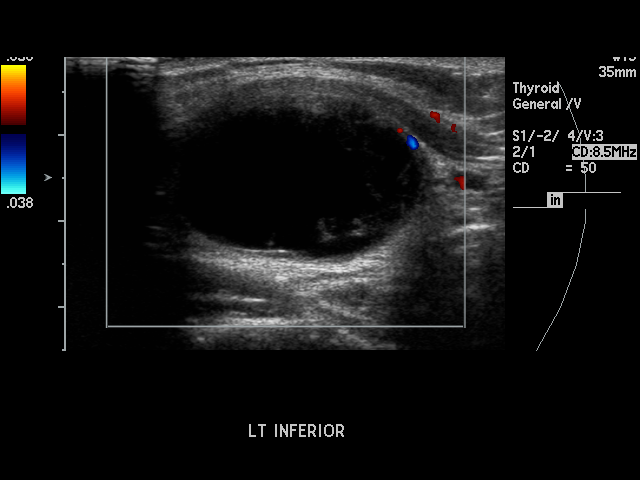
[im 21/27]
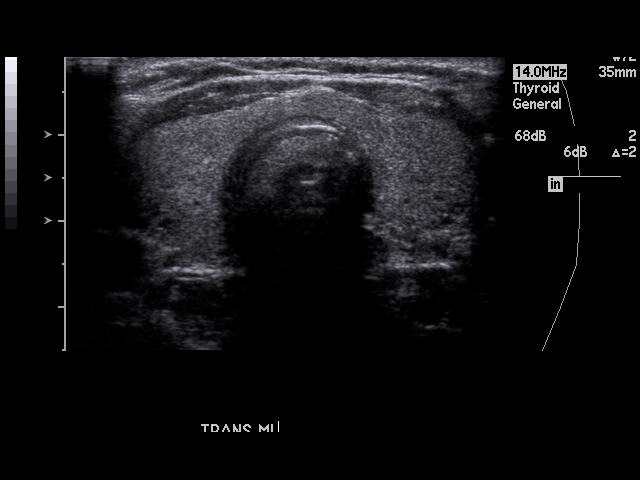
[im 23/27]
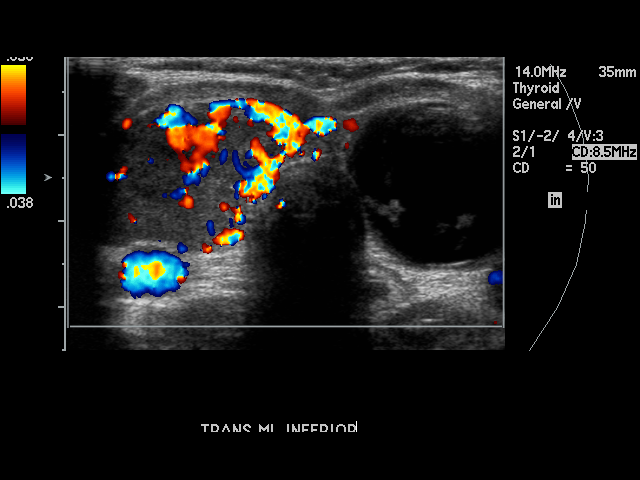
[im 24/27]
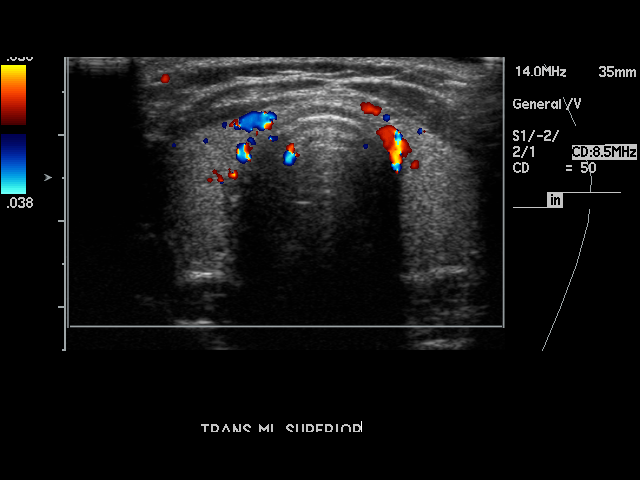
[im 27/27]
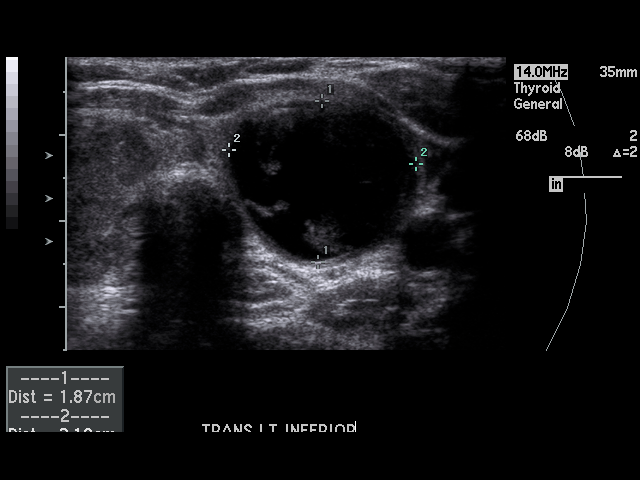

[17 of 25 positions shown; findings below may reference images not displayed]

PROCEDURE:     US  - US THYROID  - January 30, 2011  [DATE]

RESULT:     The right lobe of the thyroid measures 5.07 cm x 1.72 cm x
cm and the left lobe measures 6.29 cm x 1.36 cm x 1.4 cm. At the lower pole
of the right lobe, there is a solid, smoothly marginated, hypoechoic nodule
measuring 3.62 cm x 1.78 cm x 2.56 cm. There are a few, bright internal
echoes that may represent calcifications but this is not definite.

At the lower pole of the left lobe, there is a complex cystic nodule that
measures 2.84 cm x 2.18 cm x 2.87 cm. Internal echoes are present. No
associated microcalcifications are noted. The nodule appears hypovascular on
Doppler examination.
IMPRESSION: 1.  There is a 3.62 cm nodule at the lower pole of the right lobe. This is a
solid nodule that may contain calcifications.
2.  There is a complex, primarily cystic 2.87 cm nodule at the lower pole of
the left lobe.
3.  The thyroid echotexture at the upper poles bilaterally is homogeneous.

## 2012-10-29 IMAGING — US ULTRASOUND CORE BIOPSY
1 series · 17 of 20 positions shown · non-contrast
Comparison: none

REASON FOR EXAM: bilateral thyroid nodules
COMMENTS:

[Series 1: ultrasound core biopsy · 17 of 20 slices shown]
[im 1/20]
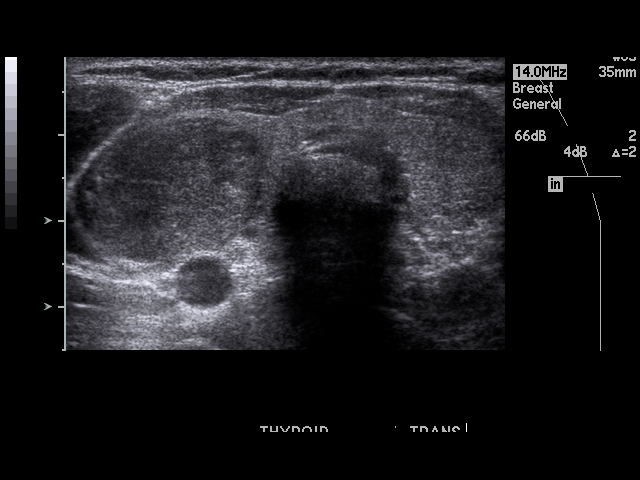
[im 2/20]
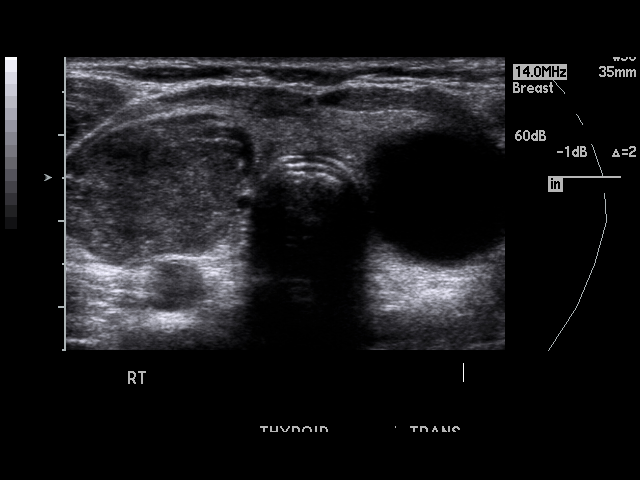
[im 3/20]
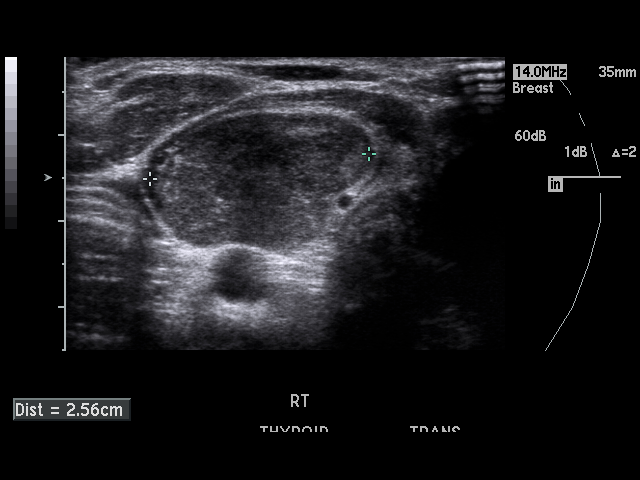
[im 5/20]
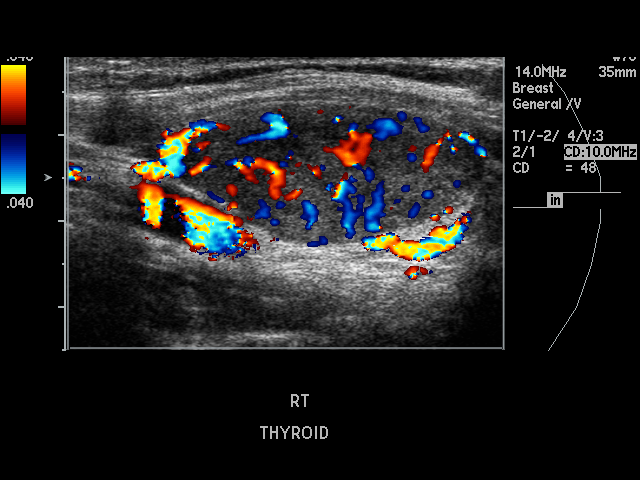
[im 6/20]
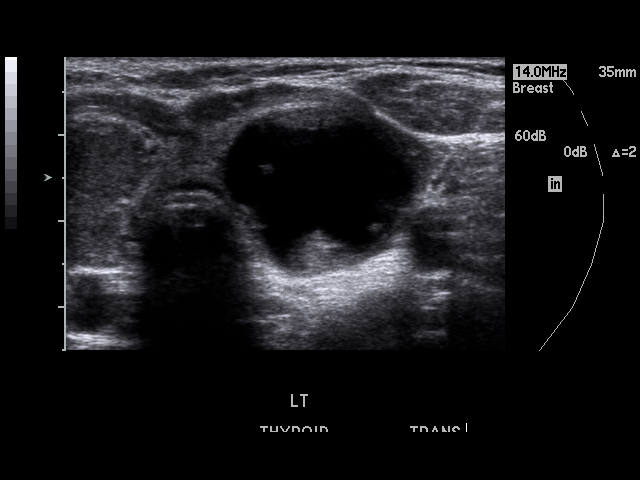
[im 7/20]
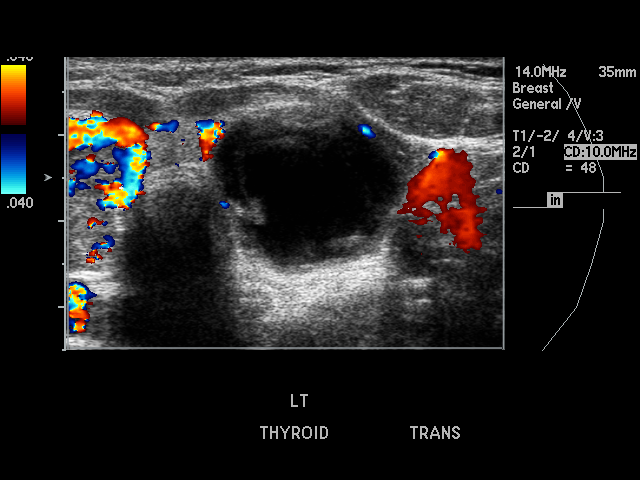
[im 8/20]
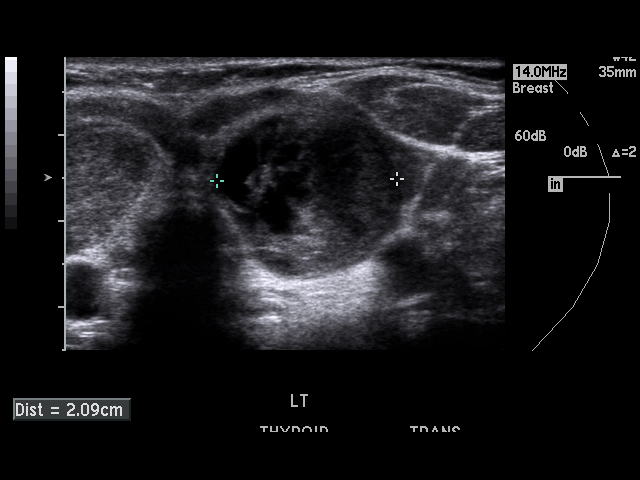
[im 9/20]
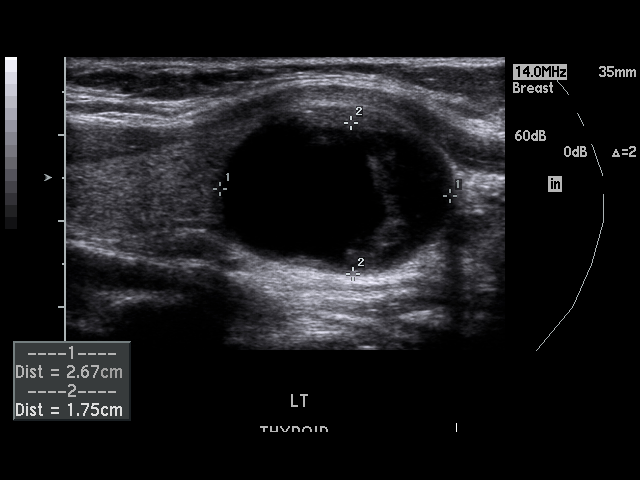
[im 11/20]
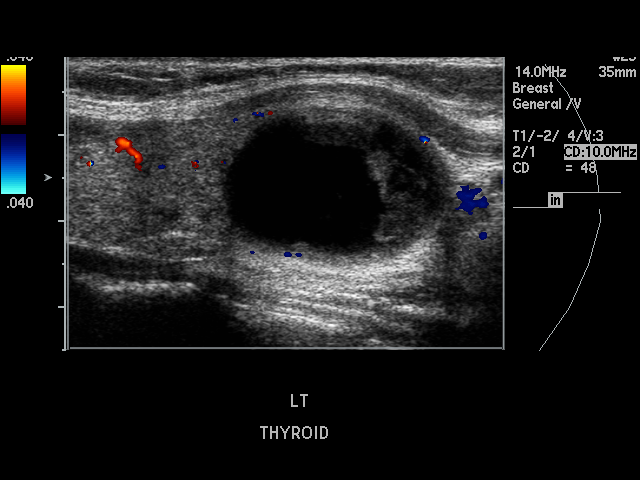
[im 12/20]
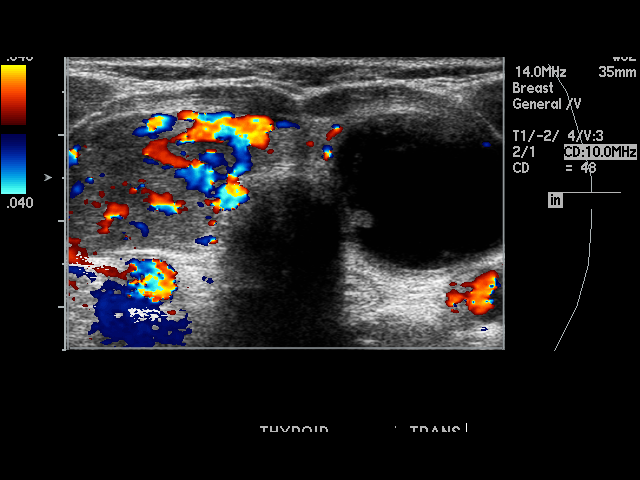
[im 13/20]
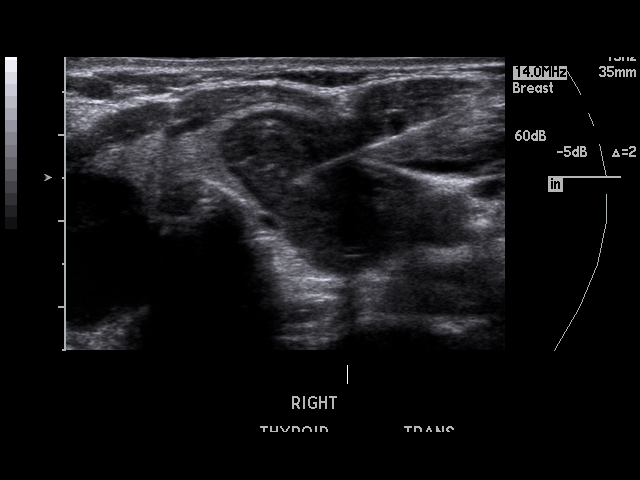
[im 14/20]
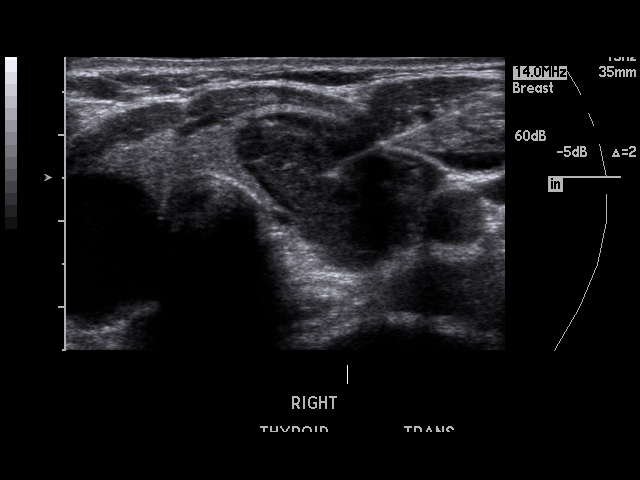
[im 15/20]
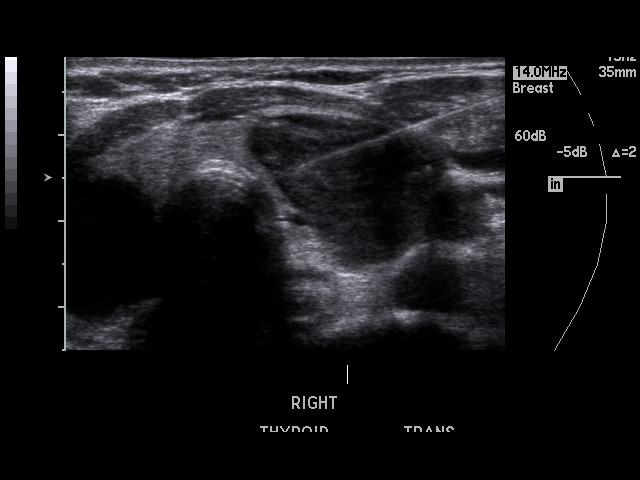
[im 16/20]
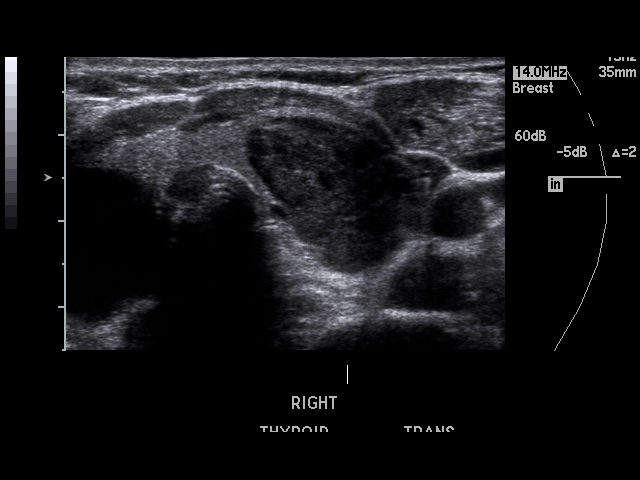
[im 18/20]
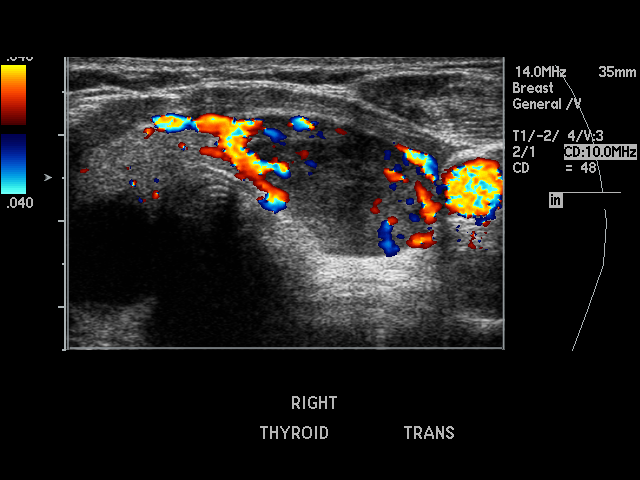
[im 19/20]
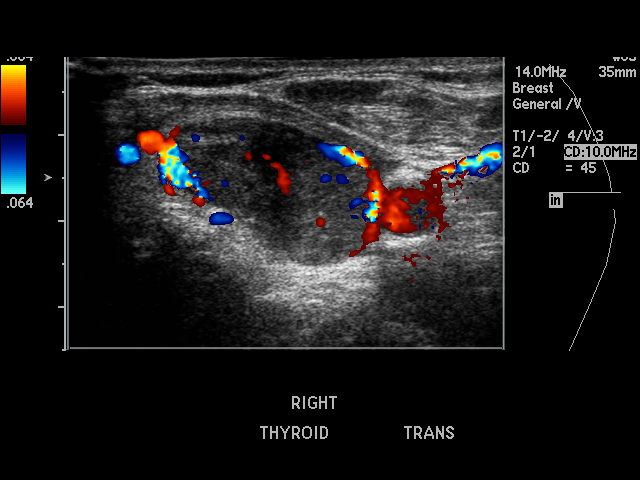
[im 20/20]
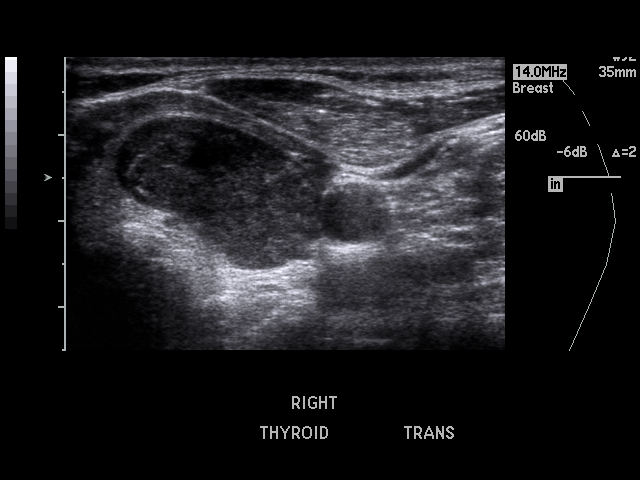

[17 of 20 positions shown; findings below may reference images not displayed]

PROCEDURE:     US  - US GUIDED BX/ASPIRATION NOT BR  - February 06, 2011  [DATE]

RESULT:     After discussing the risks and benefits of this procedure with
the patient informed consent was obtained. The patient was cleared by her
gynecologist for this biopsy. The right neck was sterilely prepped and
draped and local anesthesia administered with 1% lidocaine. Using ultrasound
guidance a 21-gauge needle was advanced into the right thyroid solid nodule
and multiple samples obtained throughout multiple locations within this
solid nodule. There were no complications. Hemostasis was achieved.
IMPRESSION: 1. Successful ultrasound-directed biopsy.

## 2014-06-14 ENCOUNTER — Encounter: Payer: Self-pay | Admitting: Obstetrics & Gynecology

## 2014-12-05 NOTE — Consult Note (Signed)
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  Annette Nguyen and her husband are here for a follow up discussion to review SIS results and antiphospholipid antibody screening test results.  She had negative APLAS screening: 09/24/11 Anticardiolipin Antibody (IgG) <9 (normall); PT/PTT normal, negative RNP antibodies, negative Anti DS DNA antibody, and Anti Beta-2 glycoprotein 1 Ab <9 (normal).  Her SIS demonstrated a uterine septum involving approximately 1/3 the length of the uterine cavity.    Referring Physician Dr. Laverta Baltimore    Prenatal Hx Prepregnancy consult    Past Obstetrical Hx G1 -- 10/2010: 6 week SAB requiring D&C; no surgical complications. Patient reported 2 days of bloating prior to diagnosis G2 -- Delivered 06/21/2011 On 06/20/2011, the patient was diagnosed with a fetal death. She underwent a misoprostol labor induction and delivered on 06/21/2011. There were no complications of the induction. The only thing the patient noted prior to the diagnosis was 1 day of bloating.  Work up for the fetal death showed thrombus within the placenta. Fetal karyotype was ordered, but the cells did not grow. There was no autopsy Other tests that were done that were normal: A positive with antibody screen negatviel CBC; GBS culture negative; RPR; Rubella immune; toxoplasma antibody negative; CMV antibody IgG positive; HSV1 and 2 both IgG positive; TSH normal; HgbA1C 5.1; drug screen negative  Of note her PT was normal at 13.1 sec (11.6-15.2), and her PTT was normal at 27 sec (24-37), but on 07/24/2011 the tests were repeated PT = 50 sec (0.-49.5) [Elevated] PT INR = 1.26 (0-1.2) [Elevated] Thrombin Time = 22.9 sec (0-20) [Elevated] PTT-LA = 52.3 sec (0-50.0) [Elevated] PTT-LA Mix = 37.4 sec (0-50.0) PTT-LA Incub Mix = 37.4 sec (0-50.0) dRVVT = 58.8 sec (0-55.1) ) [Elevated] dRVVT Mix = 36.6 sec (0-45.4) These above were considered to  consistent with a lupus anticoagulant On 07/24/2011, the following tests were normal and/or negative ??? Screens for proteins S and C deficiency, screens for prothrombin gene mutation and Leiden factor V mutation.   Home Medications: Medication Instructions Status  Prenatal Multivitamins oral tablet 1 tab(s) orally once a day Active  Protonix 40 mg oral delayed release tablet 1 tab(s) orally once a day Active  Levothroid 100 mcg (0.1 mg) oral tablet 1 tab(s) orally once a day Active  aspirin 81 mg oral tablet 1 tab(s) orally once a day PRN pain Active   Allergies:   Sulfa drugs: Rash  Vital Signs/Notes:  Nursing Vital Signs: **Vital Signs.:   28-Feb-13 11:09   Vital Signs Type Routine   Pulse Pulse 72   Pulse source per Dinamap   Respirations Respirations 12   Systolic BP Systolic BP 235   Diastolic BP (mmHg) Diastolic BP (mmHg) 81   Mean BP 95   BP Source Dinamap   Perinatal Consult:   Past Medical History cont'd Surgery --Tonsil removal age 62 -- In 07/2011, the patient has a total thyroidectomy for bilateral thyroid nodules. The pathology was benign. There were no complications to the surgery. The patient is on replacement. Her calcium is normal.  Ilness --Also with the 2006 ultrasound, the patient was diagnosed with liver hemangiomata. A CT scan confirmed the hemangiomata. A flow up U/S on 2010 again showed the liver hemangiomata ??? 2 in right lobe with the larger measuring 1.7 x 1.9 x 1.4 cm.   --GI reflux  Fam Hx Mother and father and only brother alive and well.   Impression/Recommendations:  Impression We reviewed the results of her lab testing and the SIS.  She and her husband were disppointed to not have a potential etiology for the pregnancy loss.  They expressed frustration at what seemed to be conflicting information from perinatologists, as they were previously told they would have heparin and aspirin therapy during pregnancy for treatment of  APLAS.  I  explained the criteria for diagnosis (including postive testing on two occasions as a component of diagnostic criteria) and the fact that anticoagulation was not indicated at this time.   --We actually conference called Dr. Patric Dykes (she and her husband were initially scheduled to meet with him but rescheduled appointment date) and he reiterated this plan and the fact that we, as an mfm group reviewed the literature, the ACOG guidelines, and  decided against empiric anticoagulation with heparin/lovenox in this setting. They asked appropriate questions and (although upset about the lack of explanation for their loss beyond evidence of placental thrombus) appeared satisfied with the discussion.  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    Recommendations 1. I would offer REI consultation/evaluation for this couple.  We discussed the resection of the uterine septum and although most associated with first and earlier second trimester losses, surgical correction would offer a better chance for successful pregnancy outcomes.   I suggested Dr. Mertha Finders from [facility] as a potential REI provider who could both provide additional information/support to this couple and perform the procedure, if desired and you chose to refer them for the procedure.  2. I did not recommend aspirin and heparin/lovenox therapy given the negative screening for APLAS.   I offered to provide additional literature to the couple and will email them with these resources. ([email or web address]) 3. They would like to follow up with Dr. Patric Dykes for further discussion after they review options and meet with you.  If she decides to have resection of the uterine septum, they can certainly schedule an appointment following this surgery.     Total Time Spent with Patient 45 minutes    >50% of visit spent in couseling/coordination of care yes    Office Use Only [place] Visit Level 5 (15min) EST comprehensive office/outpt    Coding Description: MATERNAL CONDITIONS/HISTORY INDICATION(S).   Previous stillborn or neonatal death - Poor Reproductive History.   Thyroid dysfunction in pregnancy.  Electronic Signatures: Manfred Shirts (MD)  (Signed [phone] 16:27)  Authored: Referral, Home Medications, Allergies, Vital Signs/Notes, Consult, Impression, Billing, Coding Description   Last Updated: 28-Feb-13 16:27 by Manfred Shirts (MD)

## 2014-12-05 NOTE — Consult Note (Signed)
Referral Information:   Reason for Referral Recent fetal death and possible positive lupus anticoagulant    Referring Physician Dr. Laverta Baltimore    Prenatal Hx Prepregnancy consult    Past Obstetrical Hx G1 -- 10/2010: 6 week SAB requiring D&C; no surgical complications. Patient reported 2 days of bloating prior to diagnosis G2 -- Delivered 06/21/2011 On 06/20/2011, the patient was diagnosed with a fetal death. She underwent a misoprostol labor induction and delivered on 06/21/2011. There were no complications of the induction. The only thing the patient noted prior to the diagnosis was 1 day of bloating.  Work up for the fetal death showed thrombus within the placenta. Fetal karyotype was ordered, but the cells did not grow. There was no autopsy Other tests that were done that were normal: A positive with antibody screen negatviel CBC; GBS culture negative; RPR; Rubella immune; toxoplasma antibody negative; CMV antibody IgG positive; HSV1 and 2 both IgG positive; TSH normal; HgbA1C 5.1; drug screen negative  Of note her PT was normal at 13.1 sec (11.6-15.2), and her PTT was normal at 27 sec (24-37), but on 07/24/2011 the tests were repeated PT = 50 sec (0.-49.5) [Elevated] PT INR = 1.26 (0-1.2) [Elevated] Thrombin Time = 22.9 sec (0-20) [Elevated] PTT-LA = 52.3 sec (0-50.0) [Elevated] PTT-LA Mix = 37.4 sec (0-50.0) PTT-LA Incub Mix = 37.4 sec (0-50.0) dRVVT = 58.8 sec (0-55.1) ) [Elevated] dRVVT Mix = 36.6 sec (0-45.4) These above were considered to consistent with a lupus anticoagulant On 07/24/2011, the following tests were normal and/or negative ??? Screens for proteins S and C deficiency, screens for prothrombin gene mutation and Leiden factor V mutation.   Home Medications:  Prenatal Multivitamins oral tablet: 1 tab(s) orally once a day, Active  Levothroid 125 mcg (0.125 mg) oral tablet: 1 tab(s) orally once a day, Active  Protonix 40 mg oral delayed release tablet: 1 tab(s)  orally once a day, Active  aspirin 81 mg oral tablet: 1 tab(s) orally once a day, Active  Allergies:   Sulfa drugs: Rash  Vital Signs/Notes:  Nursing Vital Signs: **Vital Signs.:   04-Feb-13 10:03   Vital Signs Type Routine   Temperature Temperature (F) 99.3   Celsius 37.3   Temperature Source oral   Pulse Pulse 88   Pulse source per Dinamap   Respirations Respirations 12   Systolic BP Systolic BP 226   Diastolic BP (mmHg) Diastolic BP (mmHg) 69   Mean BP 89   BP Source Dinamap   Perinatal Consult:   Past Medical History cont'd Surgery --Tonsil removal age 39 -- In 07/2011, the patient has a total thyroidectomy for bilateral thyroid nodules. The pathology was benign. There were no complications to the surgery. The patient is on replacement. Her calcium is normal.  Ilness --Also with the 2006 ultrasound, the patient was diagnosed with liver hemangiomata. A CT scan confirmed the hemangiomata. A flow up U/S on 2010 again showed the liver hemangiomata ??? 2 in right lobe with the larger measuring 1.7 x 1.9 x 1.4 cm.   --GI reflux  Fam Hx Mother and father and only brother alive and well.   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  I had a discussion of the [place] abormalities and her loss. I also discussed the implications of truly having APAS (positive LAC screen) in the setting of 23 week IUFD. IF confirmed, I recommend low dose thromboprophylaxis and low dose aspirin for next pregnancy.     Total Time Spent with Patient 45 minutes    >50%  of visit spent in couseling/coordination of care yes    Office Use Only [address]  Level 3 (87min) NEW office consult detailed   Coding Description: OTHER: Previous fetal dealth, hypothyroidism.  Electronic Signatures: Sande Rives (MD)  (Signed 04-Feb-13 11:42)  Authored: Referral, Home Medications, Allergies, Vital Signs/Notes, Consult, Impression, Billing, Coding Description   Last Updated: 04-Feb-13 11:42 by Sande Rives (MD)

## 2016-03-06 ENCOUNTER — Other Ambulatory Visit: Payer: Self-pay | Admitting: Obstetrics and Gynecology

## 2016-03-06 DIAGNOSIS — Z1231 Encounter for screening mammogram for malignant neoplasm of breast: Secondary | ICD-10-CM

## 2016-03-21 ENCOUNTER — Ambulatory Visit
Admission: RE | Admit: 2016-03-21 | Discharge: 2016-03-21 | Disposition: A | Discharge status: Home / Self Care | Payer: 59 | Source: Ambulatory Visit | Attending: Obstetrics and Gynecology | Admitting: Obstetrics and Gynecology

## 2016-03-21 ENCOUNTER — Other Ambulatory Visit: Payer: Self-pay | Admitting: Obstetrics and Gynecology

## 2016-03-21 DIAGNOSIS — Z1231 Encounter for screening mammogram for malignant neoplasm of breast: Secondary | ICD-10-CM

## 2016-03-21 DIAGNOSIS — R928 Other abnormal and inconclusive findings on diagnostic imaging of breast: Secondary | ICD-10-CM | POA: Diagnosis not present

## 2016-03-26 ENCOUNTER — Other Ambulatory Visit: Payer: Self-pay | Admitting: Obstetrics and Gynecology

## 2016-03-26 DIAGNOSIS — N6320 Unspecified lump in the left breast, unspecified quadrant: Secondary | ICD-10-CM

## 2016-04-06 ENCOUNTER — Ambulatory Visit: Payer: 59

## 2016-04-06 ENCOUNTER — Other Ambulatory Visit: Payer: 59

## 2016-04-25 ENCOUNTER — Ambulatory Visit
Admission: RE | Admit: 2016-04-25 | Discharge: 2016-04-25 | Disposition: A | Discharge status: Home / Self Care | Payer: 59 | Source: Ambulatory Visit | Attending: Obstetrics and Gynecology | Admitting: Obstetrics and Gynecology

## 2016-04-25 DIAGNOSIS — N63 Unspecified lump in breast: Secondary | ICD-10-CM | POA: Diagnosis not present

## 2016-04-25 DIAGNOSIS — N6320 Unspecified lump in the left breast, unspecified quadrant: Secondary | ICD-10-CM

## 2016-06-04 ENCOUNTER — Other Ambulatory Visit: Payer: Self-pay | Admitting: Obstetrics and Gynecology

## 2016-06-04 DIAGNOSIS — N63 Unspecified lump in unspecified breast: Secondary | ICD-10-CM

## 2016-10-24 ENCOUNTER — Ambulatory Visit: Payer: 59

## 2016-11-30 ENCOUNTER — Ambulatory Visit
Admission: RE | Admit: 2016-11-30 | Discharge: 2016-11-30 | Disposition: A | Discharge status: Home / Self Care | Payer: 59 | Source: Ambulatory Visit | Attending: Obstetrics and Gynecology | Admitting: Obstetrics and Gynecology

## 2016-11-30 DIAGNOSIS — N6322 Unspecified lump in the left breast, upper inner quadrant: Secondary | ICD-10-CM | POA: Diagnosis not present

## 2016-11-30 DIAGNOSIS — N63 Unspecified lump in unspecified breast: Secondary | ICD-10-CM

## 2016-11-30 DIAGNOSIS — N6002 Solitary cyst of left breast: Secondary | ICD-10-CM | POA: Diagnosis not present

## 2017-07-15 ENCOUNTER — Other Ambulatory Visit: Payer: Self-pay | Admitting: Obstetrics and Gynecology

## 2017-07-15 DIAGNOSIS — N6320 Unspecified lump in the left breast, unspecified quadrant: Secondary | ICD-10-CM

## 2017-08-22 ENCOUNTER — Ambulatory Visit
Admission: RE | Admit: 2017-08-22 | Discharge: 2017-08-22 | Disposition: A | Discharge status: Home / Self Care | Payer: Managed Care, Other (non HMO) | Source: Ambulatory Visit | Attending: Obstetrics and Gynecology | Admitting: Obstetrics and Gynecology

## 2017-08-22 ENCOUNTER — Encounter: Payer: Self-pay | Admitting: Radiology

## 2017-08-22 DIAGNOSIS — N6320 Unspecified lump in the left breast, unspecified quadrant: Secondary | ICD-10-CM

## 2017-08-22 DIAGNOSIS — N6321 Unspecified lump in the left breast, upper outer quadrant: Secondary | ICD-10-CM | POA: Insufficient documentation

## 2017-08-22 DIAGNOSIS — N6001 Solitary cyst of right breast: Secondary | ICD-10-CM | POA: Insufficient documentation

## 2018-03-17 ENCOUNTER — Other Ambulatory Visit: Payer: Self-pay | Admitting: Obstetrics and Gynecology

## 2018-03-17 DIAGNOSIS — N6002 Solitary cyst of left breast: Secondary | ICD-10-CM

## 2018-07-28 ENCOUNTER — Other Ambulatory Visit: Payer: Self-pay

## 2018-07-28 ENCOUNTER — Encounter: Payer: Self-pay | Admitting: Emergency Medicine

## 2018-07-28 ENCOUNTER — Emergency Department
Admission: EM | Admit: 2018-07-28 | Discharge: 2018-07-28 | Disposition: A | Discharge status: Home / Self Care | Payer: Managed Care, Other (non HMO) | Attending: Emergency Medicine | Admitting: Emergency Medicine

## 2018-07-28 ENCOUNTER — Emergency Department: Payer: Managed Care, Other (non HMO)

## 2018-07-28 DIAGNOSIS — R51 Headache: Secondary | ICD-10-CM | POA: Diagnosis present

## 2018-07-28 DIAGNOSIS — R519 Headache, unspecified: Secondary | ICD-10-CM

## 2018-07-28 MED ORDER — BUTALBITAL-APAP-CAFFEINE 50-325-40 MG PO TABS: 1.0000 | ORAL_TABLET | Freq: Four times a day (QID) | ORAL | 0 refills | Status: AC | PRN

## 2018-07-28 MED ORDER — BUTALBITAL-APAP-CAFFEINE 50-325-40 MG PO TABS
2.0000 | ORAL_TABLET | Freq: Once | ORAL | Status: AC
  Administered 2018-07-28: 2 via ORAL
  Filled 2018-07-28: qty 2

## 2018-07-28 NOTE — ED Notes (Signed)
Patient transported to CT 

## 2018-07-28 NOTE — ED Triage Notes (Signed)
Pt in via POV with complaints of ongoing and worsening headache since Saturday, developing N/V and one episode of blurred vision today.  Pt denies hx of migraines.  Pt ambulatory to triage, vitals WDL, NAD noted at this time.

## 2018-07-28 NOTE — ED Notes (Signed)
Provider at bedside

## 2018-07-28 NOTE — ED Provider Notes (Signed)
[facility] Emergency Department Provider Note  Time seen: 8:57 PM  I have reviewed the triage vital signs and the nursing notes.   HISTORY  Chief Complaint Migraine    HPI Annette Nguyen is a 42 y.o. female with a past medical history of fibroids presents to the emergency department for headache.  According to the patient for the past 3 days she has been experiencing a mostly left-sided headache.  Patient states he rarely ever gets headaches, states the headache for started Saturday morning she awoke with a mild headache that progressively worsened throughout the day.  States it became somewhat moderate in severity.  States yesterday it was somewhat better however today the headache returned and she was nauseated with one episode of vomiting.  Patient denies any weakness or numbness confusion or slurred speech.  No history of migraines previously.  States no changes with light but does seem to be affected by loud noises.   Past Medical History:  Diagnosis Date  . Cervix abnormality anteverted  . Fibroid   . Uterus, septate     Patient Active Problem List   Diagnosis Date Noted  . 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 Fetal demise, greater than 22 weeks, antepartum 06/22/2011    Past Surgical History:  Procedure Laterality Date  . DILATION AND CURETTAGE OF UTERUS    . TOTAL THYROIDECTOMY  2012    Prior to Admission medications   Medication Sig Start Date End Date Taking? Authorizing Provider  ibuprofen (ADVIL,MOTRIN) 600 MG tablet Take 1 tablet by mouth as needed. 06/22/11   [provider]  oxyCODONE-acetaminophen (PERCOCET) 5-325 MG per tablet Take 1 tablet by mouth as needed. 07/04/11   Osborne Oman, MD  pantoprazole (PROTONIX) 40 MG tablet  05/04/11   [provider]    Allergies  Allergen Reactions  . Sulfa Antibiotics Rash    Family History  Problem Relation Age of Onset  . Breast cancer Neg Hx      Social History Social History   Tobacco Use  . Smoking status: Never Smoker  . Smokeless tobacco: Never Used  Substance Use Topics  . Alcohol use: Yes    Comment: Occasional  . Drug use: No    Review of Systems Constitutional: Negative for fever. Eyes: Negative for photophobia ENT: Recently had earwax washed out from her ears 2 weeks ago Cardiovascular: Negative for chest pain. Respiratory: Negative for shortness of breath. Gastrointestinal: Negative for abdominal pain, vomiting  Musculoskeletal: Negative for musculoskeletal complaints Skin: Negative for skin complaints  Neurological: Moderate headache mostly left-sided. All other ROS negative  ____________________________________________   PHYSICAL EXAM:  VITAL SIGNS: ED Triage Vitals  Enc Vitals Group     BP 07/28/18 2009 (!) 155/85     Pulse Rate 07/28/18 2009 71     Resp 07/28/18 2009 17     Temp 07/28/18 2009 98.4 F (36.9 C)     Temp Source 07/28/18 2009 Oral     SpO2 07/28/18 2009 99 %     Weight 07/28/18 2013 165 lb (74.8 kg)     Height 07/28/18 2013 5\' 5"  (1.651 m)     Head Circumference --      Peak Flow --      Pain Score 07/28/18 2012 1     Pain Loc --      Pain Edu? --      Excl. in [place]? --    Constitutional: Alert and oriented. Well appearing  and in no distress. Eyes: Normal exam, PERRLA, no photophobia ENT   Head: Normocephalic and atraumatic.  Normal tympanic membranes.   Mouth/Throat: Mucous membranes are moist. Cardiovascular: Normal rate, regular rhythm. No murmur Respiratory: Normal respiratory effort without tachypnea nor retractions. Breath sounds are clear Gastrointestinal: Soft and nontender. No distention.   Musculoskeletal: Nontender with normal range of motion in all extremities.  Neurologic:  Normal speech and language. No gross focal neurologic deficits are appreciated.  Equal grip strength.  Intact finger-to-nose testing, no pronator drift, 5/5 motor in extremities, no  cranial nerve deficits. Skin:  Skin is warm, dry and intact.  Psychiatric: Mood and affect are normal.     RADIOLOGY  CT is negative  ____________________________________________   INITIAL IMPRESSION / ASSESSMENT AND PLAN / ED COURSE  Pertinent labs & imaging results that were available during my care of the patient were reviewed by me and considered in my medical decision making (see chart for details).  Patient presents to the emergency department for 3 days of headache, states progressive onset/worsening, now with nausea and one episode of vomiting today.  Overall the patient appears very well with reassuring physical neurological exam.  However given no history of headaches we will proceed with CT imaging the head to rule out intracranial abnormality such as tumor mass lesion or bleed.  Patient agreeable to plan of care.  We will likely treat the patient's headache once CT results are known.  CT scan is negative.  Overall the patient appears very well.  We will discharge with a course of Fioricet and PCP follow-up.  Patient agreeable to plan of care.  ____________________________________________   FINAL CLINICAL IMPRESSION(S) / ED DIAGNOSES  Headache    Harvest Dark, MD 07/28/18 2127

## 2018-07-28 NOTE — ED Notes (Signed)
Introduced to pt and family, initial assessment completed. Call bell within reach. Awaits provider for evaluation at this time.

## 2018-07-28 NOTE — ED Notes (Signed)
Pt ambulatory to POV without difficulty. VSS. NAD. Discharge instructions, RX and follow up reviewed. All questions and concerns addressed.  

## 2019-03-17 ENCOUNTER — Other Ambulatory Visit: Payer: Self-pay | Admitting: Obstetrics and Gynecology

## 2019-03-17 DIAGNOSIS — N63 Unspecified lump in unspecified breast: Secondary | ICD-10-CM

## 2019-03-18 ENCOUNTER — Other Ambulatory Visit: Payer: Self-pay | Admitting: Obstetrics and Gynecology

## 2019-03-18 DIAGNOSIS — N63 Unspecified lump in unspecified breast: Secondary | ICD-10-CM

## 2019-04-13 ENCOUNTER — Ambulatory Visit
Admission: RE | Admit: 2019-04-13 | Discharge: 2019-04-13 | Disposition: A | Discharge status: Home / Self Care | Payer: Managed Care, Other (non HMO) | Source: Ambulatory Visit | Attending: Obstetrics and Gynecology | Admitting: Obstetrics and Gynecology

## 2019-04-13 DIAGNOSIS — N63 Unspecified lump in unspecified breast: Secondary | ICD-10-CM

## 2021-01-05 ENCOUNTER — Ambulatory Visit
Admission: EM | Admit: 2021-01-05 | Discharge: 2021-01-05 | Disposition: A | Discharge status: Home / Self Care | Payer: Managed Care, Other (non HMO) | Attending: Emergency Medicine | Admitting: Emergency Medicine

## 2021-01-05 ENCOUNTER — Other Ambulatory Visit: Payer: Self-pay

## 2021-01-05 DIAGNOSIS — J0100 Acute maxillary sinusitis, unspecified: Secondary | ICD-10-CM

## 2021-01-05 DIAGNOSIS — Z1152 Encounter for screening for COVID-19: Secondary | ICD-10-CM

## 2021-01-05 MED ORDER — AMOXICILLIN 875 MG PO TABS: 875.0000 mg | ORAL_TABLET | Freq: Two times a day (BID) | ORAL | 0 refills | Status: AC

## 2021-01-05 NOTE — ED Provider Notes (Signed)
[facility]    CSN: [id number] Arrival date & time: 01/05/21  1053      History   Chief Complaint Chief Complaint  Patient presents with  . Facial Pain    HPI Annette Nguyen is a 45 y.o. female.   Patient presents with 2-week history of sinus congestion, postnasal drip, sinus pressure, nonproductive cough.  She states her symptoms have gotten worse in the past few days.  She denies fever, chills, ear pain, sore throat, shortness of breath, or other symptoms.  Treatment attempted at home with Mucinex.  No pertinent medical history.    The history is provided by the patient.    Past Medical History:  Diagnosis Date  . Cervix abnormality anteverted  . Fibroid   . Uterus, septate     Patient Active Problem List   Diagnosis Date Noted  . 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 Fetal demise, greater than 22 weeks, antepartum 06/22/2011    Past Surgical History:  Procedure Laterality Date  . DILATION AND CURETTAGE OF UTERUS    . TOTAL THYROIDECTOMY  2012    OB History    Gravida  2   Para  1   Term      Preterm  1   AB  1   Living  0     SAB  1   IAB      Ectopic      Multiple      Live Births               Home Medications    Prior to Admission medications   Medication Sig Start Date End Date Taking? Authorizing Provider  amoxicillin (AMOXIL) 875 MG tablet Take 1 tablet (875 mg total) by mouth 2 (two) times daily for 7 days. 01/05/21 01/12/21 Yes Sharion Balloon, NP  ibuprofen (ADVIL,MOTRIN) 600 MG tablet Take 1 tablet by mouth as needed. 06/22/11   [provider]  oxyCODONE-acetaminophen (PERCOCET) 5-325 MG per tablet Take 1 tablet by mouth as needed. 07/04/11   Osborne Oman, MD  pantoprazole (PROTONIX) 40 MG tablet  05/04/11   [provider]    Family History Family History  Problem Relation Age of Onset  . Breast cancer Neg Hx     Social History Social History   Tobacco Use  . Smoking  status: Never Smoker  . Smokeless tobacco: Never Used  Vaping Use  . Vaping Use: Never used  Substance Use Topics  . Alcohol use: Yes    Comment: Occasional  . Drug use: No     Allergies   Sulfa antibiotics   Review of Systems Review of Systems  Constitutional: Negative for chills and fever.  HENT: Positive for congestion, postnasal drip, rhinorrhea and sinus pressure. Negative for ear pain and sore throat.   Respiratory: Positive for cough. Negative for shortness of breath.   Cardiovascular: Negative for chest pain and palpitations.  Gastrointestinal: Negative for abdominal pain, diarrhea and vomiting.  Skin: Negative for color change and rash.  All other systems reviewed and are negative.    Physical Exam Triage Vital Signs ED Triage Vitals  Enc Vitals Group     BP      Pulse      Resp      Temp      Temp src      SpO2      Weight      Height      Head  Circumference      Peak Flow      Pain Score      Pain Loc      Pain Edu?      Excl. in [place]?    No data found.  Updated Vital Signs BP 136/90   Pulse 77   Temp 97.7 F (36.5 C) (Oral)   Resp 16   Ht 5\' 5"  (1.651 m)   Wt 155 lb (70.3 kg)   SpO2 98%   BMI 25.79 kg/m   Visual Acuity Right Eye Distance:   Left Eye Distance:   Bilateral Distance:    Right Eye Near:   Left Eye Near:    Bilateral Near:     Physical Exam Vitals and nursing note reviewed.  Constitutional:      General: She is not in acute distress.    Appearance: She is well-developed.  HENT:     Head: Normocephalic and atraumatic.     Right Ear: Tympanic membrane normal.     Left Ear: Tympanic membrane normal.     Nose: Congestion present.     Mouth/Throat:     Mouth: Mucous membranes are moist.     Pharynx: Oropharynx is clear.  Eyes:     Conjunctiva/sclera: Conjunctivae normal.  Cardiovascular:     Rate and Rhythm: Normal rate and regular rhythm.     Heart sounds: Normal heart sounds.  Pulmonary:     Effort: Pulmonary  effort is normal. No respiratory distress.     Breath sounds: Normal breath sounds.  Abdominal:     Palpations: Abdomen is soft.     Tenderness: There is no abdominal tenderness.  Musculoskeletal:     Cervical back: Neck supple.  Skin:    General: Skin is warm and dry.  Neurological:     General: No focal deficit present.     Mental Status: She is alert and oriented to person, place, and time.     Gait: Gait normal.  Psychiatric:        Mood and Affect: Mood normal.        Behavior: Behavior normal.      UC Treatments / Results  Labs (all labs ordered are listed, but only abnormal results are displayed) Labs Reviewed  NOVEL CORONAVIRUS, NAA    EKG   Radiology No results found.  Procedures Procedures (including critical care time)  Medications Ordered in UC Medications - No data to display  Initial Impression / Assessment and Plan / UC Course  I have reviewed the triage vital signs and the nursing notes.  Pertinent labs & imaging results that were available during my care of the patient were reviewed by me and considered in my medical decision making (see chart for details).   Acute sinusitis.  Treating with amoxicillin.  Discussed symptomatic treatment with ibuprofen and plain over-the-counter Mucinex.  Instructed patient to follow-up with her PCP if her symptoms are not improving.  She agrees to plan of care.   Final Clinical Impressions(s) / UC Diagnoses   Final diagnoses:  Acute non-recurrent maxillary sinusitis  Encounter for screening for COVID-19     Discharge Instructions     Take the amoxicillin as directed.  Take ibuprofen and plain over-the-counter Mucinex as directed.    Follow up with your primary care provider if your symptoms are not improving.        ED Prescriptions    Medication Sig Dispense Auth. Provider   amoxicillin (AMOXIL) 875 MG tablet Take 1  tablet (875 mg total) by mouth 2 (two) times daily for 7 days. 14 tablet Sharion Balloon, NP     PDMP not reviewed this encounter.   Sharion Balloon, NP 01/05/21 1137

## 2021-01-05 NOTE — Discharge Instructions (Signed)
Take the amoxicillin as directed.  Take ibuprofen and plain over-the-counter Mucinex as directed.    Follow up with your primary care provider if your symptoms are not improving.

## 2021-01-05 NOTE — ED Triage Notes (Signed)
Pt reports having sinus pressure, non productive cough and post nasal drip x3 weeks. Neg at home covid test 2 weeks ago.

## 2021-01-06 LAB — SARS-COV-2, NAA 2 DAY TAT

## 2021-01-06 LAB — NOVEL CORONAVIRUS, NAA: SARS-CoV-2, NAA: NOT DETECTED

## 2021-07-02 ENCOUNTER — Ambulatory Visit
Admission: RE | Admit: 2021-07-02 | Discharge: 2021-07-02 | Disposition: A | Discharge status: Home / Self Care | Payer: Managed Care, Other (non HMO) | Source: Ambulatory Visit | Attending: Emergency Medicine | Admitting: Emergency Medicine

## 2021-07-02 ENCOUNTER — Other Ambulatory Visit: Payer: Self-pay

## 2021-07-02 VITALS — BP 126/87 | HR 74 | Temp 99.1°F | Resp 20

## 2021-07-02 DIAGNOSIS — J069 Acute upper respiratory infection, unspecified: Secondary | ICD-10-CM | POA: Diagnosis not present

## 2021-07-02 DIAGNOSIS — H6692 Otitis media, unspecified, left ear: Secondary | ICD-10-CM

## 2021-07-02 DIAGNOSIS — Z1152 Encounter for screening for COVID-19: Secondary | ICD-10-CM

## 2021-07-02 MED ORDER — AMOXICILLIN 875 MG PO TABS: 875.0000 mg | ORAL_TABLET | Freq: Two times a day (BID) | ORAL | 0 refills | Status: AC

## 2021-07-02 NOTE — Discharge Instructions (Addendum)
Take the amoxicillin as directed for your ear infection.    Your COVID and Flu tests are pending.  You should self quarantine until the test results are back.    Take Tylenol or ibuprofen as needed for fever or discomfort.  Rest and keep yourself hydrated.    Follow-up with your primary care provider if your symptoms are not improving.

## 2021-07-02 NOTE — ED Provider Notes (Signed)
Roderic Palau    CSN: [id number] Arrival date & time: 07/02/21  1318      History   Chief Complaint Chief Complaint  Patient presents with   Cough   Nasal Congestion   Sore Throat   Otalgia    HPI Annette Nguyen is a 45 y.o. female.  Patient presents with 4 days of nasal congestion, runny nose, postnasal drip, cough.  She reports left ear pain for the past couple of days.  No fever, rash, shortness of breath, vomiting, diarrhea, or other symptoms.  Treatment at home with Tylenol.  The history is provided by the patient.   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 Authorizing Provider  amoxicillin (AMOXIL) 875 MG tablet Take 1 tablet (875 mg total) by mouth 2 (two) times daily for 7 days. 07/02/21 07/09/21 Yes Sharion Balloon, NP  ibuprofen (ADVIL,MOTRIN) 600 MG tablet Take 1 tablet by mouth as needed. 06/22/11   [provider]  oxyCODONE-acetaminophen (PERCOCET) 5-325 MG per tablet Take 1 tablet by mouth as needed. 07/04/11   Osborne Oman, MD  pantoprazole (PROTONIX) 40 MG tablet  05/04/11   [provider]    Family History Family History  Problem Relation Age of Onset   Breast cancer Neg Hx     Social History Social History   Tobacco Use   Smoking status: Never   Smokeless tobacco: Never  Vaping Use    Vaping Use: Never used  Substance Use Topics   Alcohol use: Yes    Comment: Occasional   Drug use: No     Allergies   Sulfa antibiotics   Review of Systems Review of Systems  Constitutional:  Negative for chills and fever.  HENT:  Positive for congestion, ear pain, postnasal drip and rhinorrhea. Negative for sore throat.   Respiratory:  Positive for cough. Negative for shortness of breath.   Cardiovascular:  Negative for chest pain and palpitations.  Gastrointestinal:  Negative for diarrhea and vomiting.  Skin:  Negative for color change and rash.  All other systems reviewed and are negative.   Physical Exam Triage Vital Signs ED Triage Vitals  Enc Vitals Group     BP      Pulse      Resp      Temp      Temp src      SpO2      Weight      Height      Head Circumference  Peak Flow      Pain Score      Pain Loc      Pain Edu?      Excl. in [place]?    No data found.  Updated Vital Signs BP 126/87   Pulse 74   Temp 99.1 F (37.3 C)   Resp 20   SpO2 98%   Visual Acuity Right Eye Distance:   Left Eye Distance:   Bilateral Distance:    Right Eye Near:   Left Eye Near:    Bilateral Near:     Physical Exam Vitals and nursing note reviewed.  Constitutional:      General: She is not in acute distress.    Appearance: She is well-developed.  HENT:     Head: Normocephalic and atraumatic.     Right Ear: Tympanic membrane normal.     Left Ear: Tympanic membrane is erythematous.     Nose: Congestion present.     Mouth/Throat:     Mouth: Mucous membranes are moist.     Pharynx: Oropharynx is clear.  Cardiovascular:     Rate and Rhythm: Normal rate and regular rhythm.     Heart sounds: Normal heart sounds.  Pulmonary:     Effort: Pulmonary effort is normal. No respiratory distress.     Breath sounds: Normal breath sounds.  Musculoskeletal:     Cervical back: Neck supple.  Skin:    General: Skin is warm and dry.  Neurological:     Mental Status: She is  alert.  Psychiatric:        Mood and Affect: Mood normal.        Behavior: Behavior normal.     UC Treatments / Results  Labs (all labs ordered are listed, but only abnormal results are displayed) Labs Reviewed  COVID-19, FLU A+B NAA    EKG   Radiology No results found.  Procedures Procedures (including critical care time)  Medications Ordered in UC Medications - No data to display  Initial Impression / Assessment and Plan / UC Course  I have reviewed the triage vital signs and the nursing notes.  Pertinent labs & imaging results that were available during my care of the patient were reviewed by me and considered in my medical decision making (see chart for details).   Left otitis media, URI.  Treating ear infection with amoxicillin. COVID and Flu pending.  Instructed patient to self quarantine per CDC guidelines.  Discussed symptomatic treatment including Tylenol or ibuprofen, rest, hydration.  Instructed patient to follow up with PCP if symptoms are not improving.  Patient agrees to plan of care.    Final Clinical Impressions(s) / UC Diagnoses   Final diagnoses:  Left otitis media, unspecified otitis media type  Upper respiratory tract infection, unspecified type     Discharge Instructions      Take the amoxicillin as directed for your ear infection.    Your COVID and Flu tests are pending.  You should self quarantine until the test results are back.    Take Tylenol or ibuprofen as needed for fever or discomfort.  Rest and keep yourself hydrated.    Follow-up with your primary care provider if your symptoms are not improving.           ED Prescriptions     Medication Sig Dispense Auth. Provider   amoxicillin (AMOXIL) 875 MG tablet Take 1 tablet (875 mg total) by mouth 2 (two) times daily for 7 days. 14 tablet Barkley Boards  H, NP      PDMP not reviewed this encounter.   Sharion Balloon, NP 07/02/21 [phone]

## 2021-07-02 NOTE — ED Triage Notes (Signed)
Pt here with URI sx and otalgia x 4 days. Low grade fevers at home.

## 2021-07-03 LAB — COVID-19, FLU A+B NAA
Influenza A, NAA: NOT DETECTED
Influenza B, NAA: NOT DETECTED
SARS-CoV-2, NAA: NOT DETECTED

## 2021-07-14 ENCOUNTER — Other Ambulatory Visit: Payer: Self-pay | Admitting: Obstetrics and Gynecology

## 2021-07-14 DIAGNOSIS — Z1231 Encounter for screening mammogram for malignant neoplasm of breast: Secondary | ICD-10-CM

## 2021-09-22 ENCOUNTER — Other Ambulatory Visit: Payer: Self-pay

## 2021-09-22 ENCOUNTER — Ambulatory Visit
Admission: RE | Admit: 2021-09-22 | Discharge: 2021-09-22 | Disposition: A | Discharge status: Home / Self Care | Payer: Managed Care, Other (non HMO) | Source: Ambulatory Visit | Attending: Obstetrics and Gynecology | Admitting: Obstetrics and Gynecology

## 2021-09-22 DIAGNOSIS — Z1231 Encounter for screening mammogram for malignant neoplasm of breast: Secondary | ICD-10-CM | POA: Insufficient documentation

## 2021-09-28 ENCOUNTER — Other Ambulatory Visit: Payer: Self-pay | Admitting: Obstetrics and Gynecology

## 2021-09-28 DIAGNOSIS — N6489 Other specified disorders of breast: Secondary | ICD-10-CM

## 2021-09-28 DIAGNOSIS — R928 Other abnormal and inconclusive findings on diagnostic imaging of breast: Secondary | ICD-10-CM

## 2021-10-11 ENCOUNTER — Ambulatory Visit
Admission: RE | Admit: 2021-10-11 | Discharge: 2021-10-11 | Disposition: A | Discharge status: Home / Self Care | Payer: Managed Care, Other (non HMO) | Source: Ambulatory Visit | Attending: Obstetrics and Gynecology | Admitting: Obstetrics and Gynecology

## 2021-10-11 ENCOUNTER — Other Ambulatory Visit: Payer: Self-pay

## 2021-10-11 DIAGNOSIS — N6489 Other specified disorders of breast: Secondary | ICD-10-CM | POA: Insufficient documentation

## 2021-10-11 DIAGNOSIS — R928 Other abnormal and inconclusive findings on diagnostic imaging of breast: Secondary | ICD-10-CM | POA: Insufficient documentation

## 2022-02-02 ENCOUNTER — Emergency Department
Admission: EM | Admit: 2022-02-02 | Discharge: 2022-02-02 | Disposition: A | Discharge status: Home / Self Care | Payer: Managed Care, Other (non HMO) | Attending: Student in an Organized Health Care Education/Training Program | Admitting: Student in an Organized Health Care Education/Training Program

## 2022-02-02 ENCOUNTER — Other Ambulatory Visit: Payer: Self-pay

## 2022-02-02 DIAGNOSIS — M25571 Pain in right ankle and joints of right foot: Secondary | ICD-10-CM | POA: Diagnosis not present

## 2022-02-02 DIAGNOSIS — M25579 Pain in unspecified ankle and joints of unspecified foot: Secondary | ICD-10-CM | POA: Diagnosis present

## 2022-02-02 DIAGNOSIS — M79604 Pain in right leg: Secondary | ICD-10-CM

## 2022-02-02 LAB — CBC
HCT: 43.7 % (ref 36.0–46.0)
Hemoglobin: 14.6 g/dL (ref 12.0–15.0)
MCH: 30.9 pg (ref 26.0–34.0)
MCHC: 33.4 g/dL (ref 30.0–36.0)
MCV: 92.4 fL (ref 80.0–100.0)
Platelets: 296 10*3/uL (ref 150–400)
RBC: 4.73 MIL/uL (ref 3.87–5.11)
RDW: 12.2 % (ref 11.5–15.5)
WBC: 6.6 10*3/uL (ref 4.0–10.5)
nRBC: 0 % (ref 0.0–0.2)

## 2022-02-02 LAB — BASIC METABOLIC PANEL
Anion gap: 9 (ref 5–15)
BUN: 9 mg/dL (ref 6–20)
CO2: 27 mmol/L (ref 22–32)
Calcium: 8.9 mg/dL (ref 8.9–10.3)
Chloride: 103 mmol/L (ref 98–111)
Creatinine, Ser: 0.7 mg/dL (ref 0.44–1.00)
GFR, Estimated: >60 mL/min (ref >60)
Glucose, Bld: 96 mg/dL (ref 70–99)
Potassium: 3.9 mmol/L (ref 3.5–5.1)
Sodium: 139 mmol/L (ref 135–145)

## 2022-02-02 LAB — PROTIME-INR
INR: 1 (ref 0.8–1.2)
Prothrombin Time: 12.8 seconds (ref 11.4–15.2)

## 2022-02-02 MED ORDER — IBUPROFEN 600 MG PO TABS
600.0000 mg | ORAL_TABLET | Freq: Once | ORAL | Status: AC
  Administered 2022-02-02: 600 mg via ORAL
  Filled 2022-02-02: qty 1

## 2022-02-02 NOTE — ED Triage Notes (Signed)
Pt to ED for possible snake bite to posterior right ankle within last hour. States felt sharp pinch, did not see snake. No swelling noted. No puncture marks noted at this time by this RN.

## 2022-02-02 NOTE — ED Notes (Signed)
Back of right leg just above tip of shoe with slight reddened area.  Wound cleaned with bath wipe and dried.

## 2022-04-27 ENCOUNTER — Ambulatory Visit
Admission: RE | Admit: 2022-04-27 | Discharge: 2022-04-27 | Disposition: A | Discharge status: Home / Self Care | Payer: Managed Care, Other (non HMO) | Source: Ambulatory Visit | Attending: Urgent Care | Admitting: Urgent Care

## 2022-04-27 VITALS — BP 139/86 | HR 68 | Temp 98.1°F | Resp 18

## 2022-04-27 DIAGNOSIS — R399 Unspecified symptoms and signs involving the genitourinary system: Secondary | ICD-10-CM

## 2022-04-27 LAB — POCT URINALYSIS DIP (MANUAL ENTRY)
Bilirubin, UA: NEGATIVE
Glucose, UA: NEGATIVE mg/dL
Ketones, POC UA: NEGATIVE mg/dL
Leukocytes, UA: NEGATIVE
Nitrite, UA: NEGATIVE
Protein Ur, POC: NEGATIVE mg/dL
Spec Grav, UA: 1.01 (ref 1.010–1.025)
Urobilinogen, UA: 0.2 E.U./dL
pH, UA: 6 (ref 5.0–8.0)

## 2022-04-27 NOTE — ED Provider Notes (Signed)
[facility]    CSN: [id number] Arrival date & time: 04/27/22  1807      History   Chief Complaint Chief Complaint  Patient presents with   Urinary Frequency    My primary care doctor prescribed Ciprofloxacin for a UTI on Monday, Sept 11, but it doesn't seem to be helping and my doctor is not in the office today. - Entered by patient    HPI Annette Nguyen is a 46 y.o. female.    Urinary Frequency    Patient presents with symptoms of urinary frequency for 1 week.  Symptoms started 1 week ago Saturday.  She states she obtained a UTI test kit from the pharmacy and results positive.  She contacted her PCP 5 days ago who prescribed Cipro.  Patient states she has not had any relief from taking Cipro.  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 Authorizing Provider  ibuprofen (ADVIL,MOTRIN) 600 MG tablet Take 1 tablet by mouth as needed. 06/22/11   [provider]  oxyCODONE-acetaminophen (PERCOCET) 5-325 MG per tablet Take 1 tablet by mouth as needed. 07/04/11   Osborne Oman, MD  pantoprazole (PROTONIX) 40 MG tablet  05/04/11   [provider]    Family History Family History  Problem Relation Age of Onset   Breast cancer Neg Hx     Social History Social History   Tobacco Use   Smoking status: Never    Smokeless tobacco: Never  Vaping Use   Vaping Use: Never used  Substance Use Topics   Alcohol use: Yes    Comment: Occasional   Drug use: No     Allergies   Sulfa antibiotics   Review of Systems Review of Systems  Genitourinary:  Positive for frequency.     Physical Exam Triage Vital Signs ED Triage Vitals [04/27/22 1823]  Enc Vitals Group     BP 139/86     Pulse Rate 68     Resp 18     Temp 98.1 F (36.7 C)     Temp src      SpO2 97 %     Weight      Height      Head Circumference      Peak Flow      Pain Score 0     Pain Loc      Pain Edu?      Excl. in [place]?    No data found.  Updated Vital Signs BP 139/86   Pulse 68  Temp 98.1 F (36.7 C)   Resp 18   LMP 04/13/2022 (Approximate)   SpO2 97%   Visual Acuity Right Eye Distance:   Left Eye Distance:   Bilateral Distance:    Right Eye Near:   Left Eye Near:    Bilateral Near:     Physical Exam Vitals reviewed.  Constitutional:      Appearance: Normal appearance.  Skin:    General: Skin is warm and dry.  Neurological:     General: No focal deficit present.     Mental Status: She is alert and oriented to person, place, and time.  Psychiatric:        Mood and Affect: Mood normal.        Behavior: Behavior normal.      UC Treatments / Results  Labs (all labs ordered are listed, but only abnormal results are displayed) Labs Reviewed - No data to display  EKG   Radiology No results found.  Procedures Procedures (including critical care time)  Medications Ordered in UC Medications - No data to display  Initial Impression / Assessment and Plan / UC Course  I have reviewed the triage vital signs and the nursing notes.  Pertinent labs & imaging results that were available during my care of the patient were reviewed by me and considered in my medical decision making (see chart for details).   UA is unremarkable except for a few red blood cells.  No evidence of UTI.  Offered to send  sample for culture and patient requests we send the culture to [facility].  Will attempt to print prescription for [facility] pickup.  Advised patient this was contrary to normal procedure.   Final Clinical Impressions(s) / UC Diagnoses   Final diagnoses:  UTI symptoms   Discharge Instructions   None    ED Prescriptions   None    PDMP not reviewed this encounter.   Rose Phi, [place] 04/27/22 1842

## 2022-04-27 NOTE — ED Triage Notes (Signed)
Pt. States she has been experiencing urinary frequency for the past week. Pt. Is concerned for recurrent UTI. Pt. Also states she has been treating herself w/ '500MG'$  Ciprofloxacin from a prior UTI diagnosis.

## 2022-04-27 NOTE — Discharge Instructions (Addendum)
Follow-up with your primary care provider if symptoms do not improve.

## 2022-05-01 LAB — URINE CULTURE

## 2022-09-14 ENCOUNTER — Other Ambulatory Visit: Payer: Self-pay | Admitting: Obstetrics and Gynecology

## 2022-09-14 DIAGNOSIS — N63 Unspecified lump in unspecified breast: Secondary | ICD-10-CM

## 2022-09-27 ENCOUNTER — Ambulatory Visit
Admission: RE | Admit: 2022-09-27 | Discharge: 2022-09-27 | Disposition: A | Discharge status: Home / Self Care | Payer: Managed Care, Other (non HMO) | Source: Ambulatory Visit | Attending: Obstetrics and Gynecology | Admitting: Obstetrics and Gynecology

## 2022-09-27 DIAGNOSIS — N63 Unspecified lump in unspecified breast: Secondary | ICD-10-CM | POA: Diagnosis present

## 2023-02-22 ENCOUNTER — Ambulatory Visit: Payer: Managed Care, Other (non HMO)

## 2023-02-22 DIAGNOSIS — Z83719 Family history of colon polyps, unspecified: Secondary | ICD-10-CM | POA: Diagnosis not present

## 2023-02-22 DIAGNOSIS — K6389 Other specified diseases of intestine: Secondary | ICD-10-CM | POA: Diagnosis not present

## 2023-02-22 DIAGNOSIS — Z1211 Encounter for screening for malignant neoplasm of colon: Secondary | ICD-10-CM | POA: Diagnosis not present

## 2024-02-25 ENCOUNTER — Other Ambulatory Visit: Payer: Self-pay | Admitting: Internal Medicine

## 2024-02-25 DIAGNOSIS — E782 Mixed hyperlipidemia: Secondary | ICD-10-CM

## 2024-02-25 DIAGNOSIS — Z0000 Encounter for general adult medical examination without abnormal findings: Secondary | ICD-10-CM

## 2024-03-11 ENCOUNTER — Ambulatory Visit
Admission: RE | Admit: 2024-03-11 | Discharge: 2024-03-11 | Disposition: A | Discharge status: Home / Self Care | Payer: Self-pay | Source: Ambulatory Visit | Attending: Internal Medicine | Admitting: Internal Medicine

## 2024-03-11 DIAGNOSIS — Z0000 Encounter for general adult medical examination without abnormal findings: Secondary | ICD-10-CM | POA: Insufficient documentation

## 2024-03-11 DIAGNOSIS — E782 Mixed hyperlipidemia: Secondary | ICD-10-CM | POA: Insufficient documentation
